# Patient Record
Sex: Female | Born: 1955
Health system: Southern US, Community
[De-identification: ages and names within clinical notes are randomized; demographics above are authoritative.]

## PROBLEM LIST (undated history)

## (undated) DIAGNOSIS — R7303 Prediabetes: Secondary | ICD-10-CM

## (undated) DIAGNOSIS — T7840XA Allergy, unspecified, initial encounter: Secondary | ICD-10-CM

## (undated) DIAGNOSIS — Z86010 Personal history of colonic polyps: Secondary | ICD-10-CM

## (undated) DIAGNOSIS — E669 Obesity, unspecified: Secondary | ICD-10-CM

## (undated) DIAGNOSIS — H269 Unspecified cataract: Secondary | ICD-10-CM

## (undated) HISTORY — PX: TUBAL LIGATION: SHX77

## (undated) HISTORY — DX: Personal history of colonic polyps: Z86.010

## (undated) HISTORY — DX: Unspecified cataract: H26.9

## (undated) HISTORY — DX: Obesity, unspecified: E66.9

## (undated) HISTORY — PX: WISDOM TOOTH EXTRACTION: SHX21

## (undated) HISTORY — PX: KELOID EXCISION: SHX1856

## (undated) HISTORY — DX: Allergy, unspecified, initial encounter: T78.40XA

## (undated) HISTORY — PX: CATARACT EXTRACTION W/ INTRAOCULAR LENS IMPLANT: SHX1309

## (undated) HISTORY — PX: POLYPECTOMY: SHX149

## (undated) HISTORY — PX: COLONOSCOPY: SHX174

## (undated) HISTORY — DX: Prediabetes: R73.03

---

## 1999-06-19 ENCOUNTER — Other Ambulatory Visit: Admission: RE | Admit: 1999-06-19 | Discharge: 1999-06-19 | Payer: Self-pay | Admitting: *Deleted

## 2001-05-13 ENCOUNTER — Other Ambulatory Visit: Admission: RE | Admit: 2001-05-13 | Discharge: 2001-05-13 | Payer: Self-pay | Admitting: Internal Medicine

## 2003-12-02 ENCOUNTER — Other Ambulatory Visit: Admission: RE | Admit: 2003-12-02 | Discharge: 2003-12-02 | Payer: Self-pay | Admitting: Internal Medicine

## 2006-01-28 ENCOUNTER — Ambulatory Visit: Payer: Self-pay | Admitting: Internal Medicine

## 2006-02-04 ENCOUNTER — Ambulatory Visit: Payer: Self-pay | Admitting: Internal Medicine

## 2006-02-04 ENCOUNTER — Other Ambulatory Visit: Admission: RE | Admit: 2006-02-04 | Discharge: 2006-02-04 | Payer: Self-pay | Admitting: Internal Medicine

## 2006-02-04 ENCOUNTER — Encounter: Payer: Self-pay | Admitting: Internal Medicine

## 2006-03-07 ENCOUNTER — Ambulatory Visit: Payer: Self-pay | Admitting: Internal Medicine

## 2006-03-21 ENCOUNTER — Ambulatory Visit: Payer: Self-pay | Admitting: Internal Medicine

## 2006-10-01 ENCOUNTER — Ambulatory Visit: Payer: Self-pay | Admitting: Internal Medicine

## 2006-10-21 ENCOUNTER — Ambulatory Visit: Payer: Self-pay | Admitting: Internal Medicine

## 2006-10-30 ENCOUNTER — Ambulatory Visit: Payer: Self-pay | Admitting: Internal Medicine

## 2006-10-30 LAB — CONVERTED CEMR LAB
Basophils Absolute: 0.1 10*3/uL (ref 0.0–0.1)
Eosinophils Absolute: 0.2 10*3/uL (ref 0.0–0.6)
Lymphocytes Relative: 40.6 % (ref 12.0–46.0)
MCHC: 33.5 g/dL (ref 30.0–36.0)
MCV: 83.2 fL (ref 78.0–100.0)
Neutro Abs: 3.3 10*3/uL (ref 1.4–7.7)
Platelets: 319 10*3/uL (ref 150–400)
RBC: 4.04 M/uL (ref 3.87–5.11)

## 2007-02-18 ENCOUNTER — Other Ambulatory Visit: Admission: RE | Admit: 2007-02-18 | Discharge: 2007-02-18 | Payer: Self-pay | Admitting: Obstetrics and Gynecology

## 2008-02-19 ENCOUNTER — Other Ambulatory Visit: Admission: RE | Admit: 2008-02-19 | Discharge: 2008-02-19 | Payer: Self-pay | Admitting: Obstetrics and Gynecology

## 2008-08-04 ENCOUNTER — Ambulatory Visit: Payer: Self-pay | Admitting: Obstetrics and Gynecology

## 2009-01-13 ENCOUNTER — Ambulatory Visit: Payer: Self-pay | Admitting: Family Medicine

## 2009-01-13 DIAGNOSIS — A088 Other specified intestinal infections: Secondary | ICD-10-CM | POA: Insufficient documentation

## 2009-03-15 ENCOUNTER — Other Ambulatory Visit: Admission: RE | Admit: 2009-03-15 | Discharge: 2009-03-15 | Payer: Self-pay | Admitting: Obstetrics and Gynecology

## 2009-03-15 ENCOUNTER — Encounter: Payer: Self-pay | Admitting: Obstetrics and Gynecology

## 2009-03-15 ENCOUNTER — Ambulatory Visit: Payer: Self-pay | Admitting: Obstetrics and Gynecology

## 2009-07-07 ENCOUNTER — Ambulatory Visit: Payer: Self-pay | Admitting: Obstetrics and Gynecology

## 2010-03-29 ENCOUNTER — Other Ambulatory Visit: Admission: RE | Admit: 2010-03-29 | Discharge: 2010-03-29 | Payer: Self-pay | Admitting: Obstetrics and Gynecology

## 2010-03-29 ENCOUNTER — Ambulatory Visit: Payer: Self-pay | Admitting: Obstetrics and Gynecology

## 2011-04-19 ENCOUNTER — Encounter: Payer: Self-pay | Admitting: Gynecology

## 2011-04-29 ENCOUNTER — Other Ambulatory Visit (HOSPITAL_COMMUNITY)
Admission: RE | Admit: 2011-04-29 | Discharge: 2011-04-29 | Disposition: A | Discharge status: Home / Self Care | Payer: BC Managed Care – PPO | Source: Ambulatory Visit | Attending: Obstetrics and Gynecology | Admitting: Obstetrics and Gynecology

## 2011-04-29 ENCOUNTER — Encounter: Payer: Self-pay | Admitting: Obstetrics and Gynecology

## 2011-04-29 ENCOUNTER — Ambulatory Visit (INDEPENDENT_AMBULATORY_CARE_PROVIDER_SITE_OTHER): Payer: BC Managed Care – PPO | Admitting: Obstetrics and Gynecology

## 2011-04-29 DIAGNOSIS — Z0000 Encounter for general adult medical examination without abnormal findings: Secondary | ICD-10-CM

## 2011-04-29 DIAGNOSIS — Z01419 Encounter for gynecological examination (general) (routine) without abnormal findings: Secondary | ICD-10-CM

## 2011-04-29 DIAGNOSIS — R809 Proteinuria, unspecified: Secondary | ICD-10-CM

## 2011-04-29 DIAGNOSIS — Z833 Family history of diabetes mellitus: Secondary | ICD-10-CM

## 2011-04-29 DIAGNOSIS — Z13220 Encounter for screening for lipoid disorders: Secondary | ICD-10-CM

## 2011-04-29 NOTE — Progress Notes (Signed)
The patient came to see me today for her annual GYN exam. She has had a normal bone density. She is due for her mammogram today. She is doing fine menopausally without medication.  Physical examination: HEENT within normal limits. Neck: Thyroid not large. No masses. Supraclavicular nodes: not enlarged. Breasts: Examined in both sitting midline position. No skin changes and no masses. Abdomen: Soft no guarding rebound or masses or hernia. Pelvic: External: Within normal limits. BUS: Within normal limits. Vaginal:within normal limits. Good estrogen effect. No evidence of cystocele rectocele or enterocele. Cervix: clean. Uterus: Normal size and shape. Adnexa: No masses. Rectovaginal exam: Confirmatory and negative. Extremities: Within normal limits.  Assessment: Normal GYN exam  Plan: 1. Mammogram 2. Appropriate lab work done.

## 2011-09-25 ENCOUNTER — Other Ambulatory Visit: Payer: Self-pay | Admitting: Gynecology

## 2011-09-25 DIAGNOSIS — E7800 Pure hypercholesterolemia, unspecified: Secondary | ICD-10-CM

## 2011-11-26 ENCOUNTER — Emergency Department (HOSPITAL_COMMUNITY): Payer: BC Managed Care – PPO

## 2011-11-26 ENCOUNTER — Encounter (HOSPITAL_COMMUNITY): Payer: Self-pay | Admitting: *Deleted

## 2011-11-26 ENCOUNTER — Emergency Department (HOSPITAL_COMMUNITY)
Admission: EM | Admit: 2011-11-26 | Discharge: 2011-11-26 | Disposition: A | Discharge status: Home / Self Care | Payer: BC Managed Care – PPO | Attending: Emergency Medicine | Admitting: Emergency Medicine

## 2011-11-26 ENCOUNTER — Other Ambulatory Visit: Payer: Self-pay

## 2011-11-26 DIAGNOSIS — R079 Chest pain, unspecified: Secondary | ICD-10-CM | POA: Insufficient documentation

## 2011-11-26 DIAGNOSIS — R0789 Other chest pain: Secondary | ICD-10-CM | POA: Insufficient documentation

## 2011-11-26 DIAGNOSIS — E669 Obesity, unspecified: Secondary | ICD-10-CM | POA: Insufficient documentation

## 2011-11-26 LAB — POCT I-STAT, CHEM 8
BUN: 9 mg/dL (ref 6–23)
Chloride: 105 mEq/L (ref 96–112)
Creatinine, Ser: 0.9 mg/dL (ref 0.50–1.10)
Glucose, Bld: 93 mg/dL (ref 70–99)
Hemoglobin: 13.6 g/dL (ref 12.0–15.0)
Potassium: 3.9 mEq/L (ref 3.5–5.1)
Sodium: 142 mEq/L (ref 135–145)

## 2011-11-26 LAB — POCT I-STAT TROPONIN I

## 2011-11-26 NOTE — ED Notes (Signed)
MD at bedside. (Dr. Jacubowitz) 

## 2011-11-26 NOTE — ED Notes (Signed)
Pt in c/o chest pain since this am, pain to central chest, denies other symptoms with this or radiation, describes pain as sharp[facility]

## 2011-11-26 NOTE — ED Notes (Signed)
Family at bedside. 

## 2011-11-26 NOTE — ED Provider Notes (Signed)
History     CSN: [id number]  Arrival date & time 11/26/11  1158   First MD Initiated Contact with Patient 11/26/11 1214      Chief Complaint  Patient presents with  . Chest Pain    (Consider location/radiation/quality/duration/timing/severity/associated sxs/prior treatment) HPI Complains of right-sided parasternal anterior chest pain nonradiating sudden onset 10:55 AM today. Pain is severe at first presently is mild without treatment no shortness of breath no sweatiness no nausea no other symptoms. Pain is worse with deep inspiration improving with time no other complaint History reviewed. No pertinent past medical history.  Past Surgical History  Procedure Date  . Tubal ligation   . Cesarean section    Cardiac risk factors none Family History  Problem Relation Age of Onset  . Hypertension Mother   . Diabetes Sister   . Diabetes Son     History  Substance Use Topics  . Smoking status: Never Smoker   . Smokeless tobacco: Never Used  . Alcohol Use: Yes     rare    OB History    Grav Para Term Preterm Abortions TAB SAB Ect Mult Living   1 1 1       1       Review of Systems  Cardiovascular: Positive for chest pain.  All other systems reviewed and are negative.    Allergies  Penicillins  Home Medications   Current Outpatient Rx  Name Route Sig Dispense Refill  . CALCIUM + D PO Oral Take by mouth daily.      Marland Kitchen VITAMIN D 1000 UNITS PO TABS Oral Take 1,000 Units by mouth daily.      . OMEGA-3 FATTY ACIDS 1000 MG PO CAPS Oral Take 2 g by mouth daily.      Marland Kitchen ONE-DAILY MULTI VITAMINS PO TABS Oral Take 1 tablet by mouth daily.        BP 144/75  Pulse 66  Temp(Src) 98.3 F (36.8 C) (Oral)  Resp 20  SpO2 100%  Physical Exam  Nursing note and vitals reviewed. Constitutional: She appears well-developed and well-nourished.  HENT:  Head: Normocephalic and atraumatic.  Eyes: Conjunctivae are normal. Pupils are equal, round, and reactive to light.  Neck: Neck  supple. No tracheal deviation present. No thyromegaly present.  Cardiovascular: Normal rate and regular rhythm.   No murmur heard. Pulmonary/Chest: Effort normal and breath sounds normal. She exhibits tenderness.       Mild tenderness right parasternal area reproducing pain exactly  Abdominal: Soft. Bowel sounds are normal. She exhibits no distension. There is no tenderness.       OBese  Musculoskeletal: Normal range of motion. She exhibits no edema and no tenderness.  Neurological: She is alert. Coordination normal.  Skin: Skin is warm and dry. No rash noted.  Psychiatric: She has a normal mood and affect.    ED Course  Procedures (including critical care time)  Date: 11/26/2011  Rate: 70  Rhythm: normal sinus rhythm  QRS Axis: normal  Intervals: normal  ST/T Wave abnormalities: nonspecific T wave changes  Conduction Disutrbances:none  Narrative Interpretation:   Old EKG Reviewed: none available 5:20 PM patient alert pleasant cooperative asymptomatic  Labs Reviewed  D-DIMER, QUANTITATIVE   No results found.  Results for orders placed during the hospital encounter of 11/26/11  D-DIMER, QUANTITATIVE      Component Value Range   D-Dimer, Quant 0.38  0.00 - 0.48 (ug/mL-FEU)  POCT I-STAT TROPONIN I      Component Value Range  Troponin i, poc 0.00  0.00 - 0.08 (ng/mL)   Comment 3           POCT I-STAT, CHEM 8      Component Value Range   Sodium 142  135 - 145 (mEq/L)   Potassium 3.9  3.5 - 5.1 (mEq/L)   Chloride 105  96 - 112 (mEq/L)   BUN 9  6 - 23 (mg/dL)   Creatinine, Ser 8.29  0.50 - 1.10 (mg/dL)   Glucose, Bld 93  70 - 99 (mg/dL)   Calcium, Ion 5.62  1.12 - 1.32 (mmol/L)   TCO2 28  0 - 100 (mmol/L)   Hemoglobin 13.6  12.0 - 15.0 (g/dL)   HCT 13.0  86.5 - 78.4 (%)  POCT I-STAT TROPONIN I      Component Value Range   Troponin i, poc 0.00  0.00 - 0.08 (ng/mL)   Comment 3            Dg Chest 2 View  11/26/2011  *RADIOLOGY REPORT*  Clinical Data: Chest pain.  CHEST  - 2 VIEW  Comparison: None.  Findings: No infiltrate, congestive heart failure or pneumothorax.  Mildly tortuous aorta.  Heart size top normal.  IMPRESSION: Mildly tortuous aorta.  Please see above.  Original Report Authenticated By: Fuller Canada, M.D.   No diagnosis found.    MDM   Strongly doubyt ACS ie sudden onset pleuric pain non exertional with 2 neg marker. Low pretest prob for pe and neg ddimer. Plan d/c to home Dx Non cardiac chest pain      Doug Sou, MD 11/26/11 1737

## 2011-11-26 NOTE — Discharge Instructions (Signed)
Chest Pain, Nonspecific  It is often hard to give a specific diagnosis for the cause of chest pain. There is always a chance that your pain could be related to something serious, like a heart attack or a blood clot in the lungs. You need to follow up with your caregiver for further evaluation. More lab tests or other studies such as X-rays, electrocardiography, stress testing, or cardiac imaging may be needed to find the cause of your pain.  Most of the time, nonspecific chest pain improves within 2 to 3 days with rest and mild pain medicine. For the next few days, avoid physical exertion or activities that bring on pain. Do not smoke. Avoid drinking alcohol. Call your caregiver for routine follow-up as advised.   SEEK IMMEDIATE MEDICAL CARE IF:   You develop increased chest pain or pain that radiates to the arm, neck, jaw, back, or abdomen.   You develop shortness of breath, increased coughing, or you start coughing up blood.   You have severe back or abdominal pain, nausea, or vomiting.   You develop severe weakness, fainting, fever, or chills.  Document Released: 09/09/2005 Document Revised: 08/29/2011 Document Reviewed: 02/27/2007  Exit[employer] Patient Information 2012 [employer].

## 2011-12-10 ENCOUNTER — Other Ambulatory Visit: Payer: BC Managed Care – PPO

## 2012-04-30 ENCOUNTER — Encounter: Payer: Self-pay | Admitting: Obstetrics and Gynecology

## 2012-05-07 ENCOUNTER — Encounter: Payer: Self-pay | Admitting: Obstetrics and Gynecology

## 2012-05-07 ENCOUNTER — Ambulatory Visit (INDEPENDENT_AMBULATORY_CARE_PROVIDER_SITE_OTHER): Payer: BC Managed Care – PPO | Admitting: Obstetrics and Gynecology

## 2012-05-07 ENCOUNTER — Other Ambulatory Visit (HOSPITAL_COMMUNITY)
Admission: RE | Admit: 2012-05-07 | Discharge: 2012-05-07 | Disposition: A | Discharge status: Home / Self Care | Payer: BC Managed Care – PPO | Source: Ambulatory Visit | Attending: Obstetrics and Gynecology | Admitting: Obstetrics and Gynecology

## 2012-05-07 VITALS — BP 110/70 | Ht 63.0 in | Wt 202.0 lb

## 2012-05-07 DIAGNOSIS — Z01419 Encounter for gynecological examination (general) (routine) without abnormal findings: Secondary | ICD-10-CM | POA: Insufficient documentation

## 2012-05-07 DIAGNOSIS — Z833 Family history of diabetes mellitus: Secondary | ICD-10-CM

## 2012-05-07 DIAGNOSIS — Z780 Asymptomatic menopausal state: Secondary | ICD-10-CM

## 2012-05-07 LAB — CBC WITH DIFFERENTIAL/PLATELET
HCT: 38.5 % (ref 36.0–46.0)
Hemoglobin: 12.8 g/dL (ref 12.0–15.0)
Lymphs Abs: 2.8 10*3/uL (ref 0.7–4.0)
MCHC: 33.2 g/dL (ref 30.0–36.0)
Monocytes Absolute: 0.4 10*3/uL (ref 0.1–1.0)
Monocytes Relative: 7 % (ref 3–12)
Neutro Abs: 2.7 10*3/uL (ref 1.7–7.7)
Neutrophils Relative %: 44 % (ref 43–77)
RBC: 4.66 MIL/uL (ref 3.87–5.11)

## 2012-05-07 LAB — LIPID PANEL
Cholesterol: 195 mg/dL (ref 0–200)
HDL: 55 mg/dL (ref >39)
LDL Cholesterol: 126 mg/dL — ABNORMAL HIGH (ref 0–99)
Total CHOL/HDL Ratio: 3.5 Ratio
Triglycerides: 72 mg/dL (ref <150)
VLDL: 14 mg/dL (ref 0–40)

## 2012-05-07 NOTE — Patient Instructions (Signed)
Schedule bone density.    

## 2012-05-07 NOTE — Addendum Note (Signed)
Addended by: Dayna Barker on: 05/07/2012 12:11 PM   Modules accepted: Orders

## 2012-05-07 NOTE — Progress Notes (Signed)
Patient came to see me today for her annual GYN exam. She is doing well without hormone replacement. She is up-to-date on mammograms. She is having no vaginal bleeding. She is having no pelvic pain. She had one normal bone density in 2009. She has never had an abnormal Pap smear. For the past 2 months she has been having pain in her lower extremities  mostly in her back. She says it occurs when she works her second job  and is on her feet on a concrete floor. She wanted name of a PCP. She had a normal colonoscopy 6 years ago.  Physical examination: Mechele Collin present. HEENT within normal limits. Neck: Thyroid not large. No masses. Supraclavicular nodes: not enlarged. Breasts: Examined in both sitting and lying  position. No skin changes and no masses. Abdomen: Soft no guarding rebound or masses or hernia. Pelvic: External: Within normal limits. BUS: Within normal limits. Vaginal:within normal limits. Good estrogen effect. No evidence of cystocele rectocele or enterocele. Cervix: clean. Uterus: Normal size and shape. Adnexa: No masses. Rectovaginal exam: Confirmatory and negative. Extremities: Within normal limits. Negative Homan sign. No swelling. No cords.  Assessment: Lower extremity pain.  Plan: Continue yearly mammograms. Patient to see either  Dr. Darrick Penna or Dr. Farris Has for evaluation of legs. Gave patient Dr. Nelida Meuse name for  PCP. Bone density.The new Pap smear guidelines were discussed with the patient. Patient requested pap and was done.

## 2012-05-08 ENCOUNTER — Telehealth: Payer: Self-pay | Admitting: *Deleted

## 2012-05-08 ENCOUNTER — Other Ambulatory Visit: Payer: Self-pay | Admitting: Obstetrics and Gynecology

## 2012-05-08 DIAGNOSIS — R7303 Prediabetes: Secondary | ICD-10-CM

## 2012-05-08 DIAGNOSIS — R7309 Other abnormal glucose: Secondary | ICD-10-CM

## 2012-05-08 LAB — URINALYSIS W MICROSCOPIC + REFLEX CULTURE
Casts: NONE SEEN
Glucose, UA: NEGATIVE mg/dL
Hgb urine dipstick: NEGATIVE
Leukocytes, UA: NEGATIVE
Nitrite: NEGATIVE
Squamous Epithelial / LPF: NONE SEEN
pH: 5 (ref 5.0–8.0)

## 2012-05-08 LAB — HEMOGLOBIN A1C
Hgb A1c MFr Bld: 6.3 % — ABNORMAL HIGH (ref <5.7)
Mean Plasma Glucose: 134 mg/dL — ABNORMAL HIGH (ref <117)

## 2012-05-08 NOTE — Telephone Encounter (Signed)
Message copied by Aura Camps on Fri May 08, 2012  2:07 PM ------      Message from: Keenan Bachelor      Created: Fri May 08, 2012 11:38 AM       Per Dr. Reece Agar patient to have referral to [facility]al Counseling services for elevated HgbA1C results.              Patient does want this. She said she is in school so early morning or late afternoon after 3pm will be best times to schedule appt.            Thanks Victorino Dike!!!!!!!!!!

## 2012-05-08 NOTE — Telephone Encounter (Signed)
Referral order placed, nutritional department will contact pt.

## 2012-05-11 ENCOUNTER — Encounter: Payer: Self-pay | Admitting: *Deleted

## 2012-05-11 NOTE — Telephone Encounter (Signed)
06/02/12 appointment time for the below note.

## 2012-05-11 NOTE — Progress Notes (Signed)
Patient ID: Janice Williamson, female   DOB: 1955-10-18, 56 y.o.   MRN: [id number] Per staff message appointment with Nutritional counseling services set up 06/02/12 @ 9:15 am at [facility].

## 2012-05-21 ENCOUNTER — Ambulatory Visit (INDEPENDENT_AMBULATORY_CARE_PROVIDER_SITE_OTHER): Payer: BC Managed Care – PPO

## 2012-05-21 DIAGNOSIS — Z780 Asymptomatic menopausal state: Secondary | ICD-10-CM

## 2012-05-21 DIAGNOSIS — M899 Disorder of bone, unspecified: Secondary | ICD-10-CM

## 2012-05-21 DIAGNOSIS — M8580 Other specified disorders of bone density and structure, unspecified site: Secondary | ICD-10-CM

## 2012-05-21 DIAGNOSIS — M949 Disorder of cartilage, unspecified: Secondary | ICD-10-CM

## 2012-06-02 ENCOUNTER — Encounter: Payer: BC Managed Care – PPO | Attending: Obstetrics and Gynecology | Admitting: *Deleted

## 2012-06-02 ENCOUNTER — Encounter: Payer: Self-pay | Admitting: *Deleted

## 2012-06-02 VITALS — Ht 64.0 in | Wt 199.4 lb

## 2012-06-02 DIAGNOSIS — R7309 Other abnormal glucose: Secondary | ICD-10-CM

## 2012-06-02 DIAGNOSIS — Z713 Dietary counseling and surveillance: Secondary | ICD-10-CM | POA: Insufficient documentation

## 2012-06-02 NOTE — Progress Notes (Signed)
  Medical Nutrition Therapy:  Appt start time: 0900 end time:  1000 and 1100.   Assessment:  Primary concerns today: elevated glucose.   MEDICATIONS: see list   DIETARY INTAKE:  Usual eating pattern includes 3 meals and 2-3 snacks per day.  Everyday foods include starches, fruits, vegetables, proteins.  Avoided foods include limits sweets to 1/week.    24-hr recall:  B ( AM): yogurt with walnuts and banana or boiled egg and toast with green tea unsweetened Snk ( AM): apple with peanut butter (or another fruit)  L ( PM): salad with tomatoes and either tuna or chicken and [place] dressing, 10 chips and cup jello Snk ( PM): another fruit or Nabs Snack (another Nabs D ( PM): bowl cereal ( honey nut cheerios) or boiled egg and slice toast.  On weekends eats out or has large family meal Snk ( PM): none Beverages: water and green tea.  Sweetened teas occasionally  Usual physical activity: exercises 6-7 days (jog, elliptical, and lifts weights) 45 minutes.  Has been active, but recently increased  Estimated energy needs: 1600 calories 180 g carbohydrates 120 g protein 44 g fat  Progress Towards Goal(s):  In progress.   Nutritional Diagnosis:  NB-1.1 Food and nutrition-related knowledge deficit As related to proper balance of fats, carbs, and proteins.  As evidenced by BMI of 34.2 and HgA1C of 6.3%.    Intervention:  Nutrition counseling provided.  Leanny is here for nutrition education regarding elevated glucose.  She denies family history of diabetes, but states she does want to prevent the diagnosis of diabetes.  Discussed phsyiology of diabetes and role of obesity on insulin resistance.  Encouraged moderate weight reduction.  Gayla is very physically active, but her dietary fat intake is higher than recommended.  She admits to eating fried foods and sweets on weekends and larger portions.  She eats out on the weekends, as well.  Discussed what foods increased blood glucose and  encouraged her to limit the servings of carbohydrates at meals.  Encouraged 2-3 servings/meal.  Her biggest struggle may be a late night meal when she gets off work around 9:30.  She doesn't want to cook or have a "heavy meal" so she make bowl of sugary cereal and then goes to sleep.   Discussed MyPlate recommendations and encouraged small serving lean protein and less carbs late at night.   Suggested preparing food in advance so she doesn't have to cook when she gets home.  Also encouraged limiting fried foods when eating out.  Handouts given during visit include:  My meal plan card  Reading food labels  Monitoring/Evaluation:  Dietary intake, exercise,  and body weight in 1 month(s).  Patient will follow up monthly until new HgA1C test in November

## 2012-06-02 NOTE — Patient Instructions (Signed)
Goals:  Eat 3 meals/day, Avoid meal skipping   Increase protein rich foods  Follow "Plate Method" for portion control  Limit carbohydrate1-2 servings/meal   Choose more whole grains, lean protein, low-fat dairy, and fruits/non-starchy vegetables.   Aim for >30 min of physical activity daily  Limit sugar-sweetened beverages and concentrated sweets  Limit fried foods  Limit carbohydrates late at night  Switch to diet juice

## 2012-06-03 ENCOUNTER — Ambulatory Visit: Payer: BC Managed Care – PPO | Admitting: Sports Medicine

## 2012-06-17 ENCOUNTER — Ambulatory Visit: Payer: BC Managed Care – PPO | Admitting: Sports Medicine

## 2012-07-02 ENCOUNTER — Encounter: Payer: BC Managed Care – PPO | Attending: Obstetrics and Gynecology | Admitting: *Deleted

## 2012-07-02 DIAGNOSIS — Z713 Dietary counseling and surveillance: Secondary | ICD-10-CM | POA: Insufficient documentation

## 2012-07-02 DIAGNOSIS — R7309 Other abnormal glucose: Secondary | ICD-10-CM | POA: Insufficient documentation

## 2012-07-07 ENCOUNTER — Ambulatory Visit: Payer: BC Managed Care – PPO | Admitting: Sports Medicine

## 2012-07-15 ENCOUNTER — Ambulatory Visit (INDEPENDENT_AMBULATORY_CARE_PROVIDER_SITE_OTHER): Payer: BC Managed Care – PPO | Admitting: Sports Medicine

## 2012-07-15 ENCOUNTER — Encounter: Payer: Self-pay | Admitting: Sports Medicine

## 2012-07-15 VITALS — BP 134/85 | HR 59 | Ht 64.0 in | Wt 193.0 lb

## 2012-07-15 DIAGNOSIS — M79661 Pain in right lower leg: Secondary | ICD-10-CM | POA: Insufficient documentation

## 2012-07-15 DIAGNOSIS — M79609 Pain in unspecified limb: Secondary | ICD-10-CM

## 2012-07-15 DIAGNOSIS — M79662 Pain in left lower leg: Secondary | ICD-10-CM | POA: Insufficient documentation

## 2012-07-15 NOTE — Progress Notes (Signed)
Chief complaint: Bilateral lower leg pain for 3-4 months  History of present illness: 56 year old female who is very pleasant coming in with a 3-4 month history of bilateral leg pain. Patient states that she does not remember any injury in this is more of an insidious onset over the course of months. Vision states that it seemed to start with low back pain that started about 2 months previously but then this resolved with her increasing her working out focusing on the treadmill, elliptical as well as core strengthening. Patient states that the pain is mostly on the lateral aspect of her calves but does radiate sometimes down near her ankles as well as up to the hamstrings. Patient denies any numbness 13 and sensation. Patient states it is worse with standing for a long amount of time and can come on within minutes. Patient though is able to do her treadmill walking and elliptical 3 days a week and do 45 minutes to an hour without any trouble. Patient denies any swelling any fevers chills any shortness of breath or chest pain.  Review of systems as stated above in history of present illness  Past medical history unremarkable Medications: Current outpatient prescriptions:Calcium Carbonate-Vitamin D (CALCIUM + D PO), Take by mouth daily.  , Disp: , Rfl: ;  cholecalciferol (VITAMIN D) 1000 UNITS tablet, Take 1,000 Units by mouth daily.  , Disp: , Rfl: ;  fish oil-omega-3 fatty acids 1000 MG capsule, Take 2 g by mouth daily.  , Disp: , Rfl: ;  MAGNESIUM PO, Take by mouth., Disp: , Rfl: ;  Multiple Vitamin (MULTIVITAMIN) tablet, Take 1 tablet by mouth daily.  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  Family history: Significant for diabetes in her sister.  Physical exam Blood pressure  134/85, pulse 59, height 5\' 4"  (1.626 m), weight 193 lb (87.544 kg). General: No apparent distress alert and oriented x3 mood and affect normal Respiratory: Patient to simple sentences and does not appear short of breath Skin: Clear dry intact with no signs of infection a rash Neuro: Cranial nerve II through XII are intact neurovascular intact in all extremities and 2+ DTRs. Back exam: Patient has full active range of motion, nontender to palpation, patient has negative straight leg test as well as negative Faber's test bilaterally. Patient has 5 out of 5 strength of the lower extremities. Knee exam is unremarkable with full range of motion in all ligaments intact and negative McMurray's Clinical exam: Patient's ankle appears to be in neutral position with 5 out of 5 strength neurovascularly intact distally. Foot exam: Patient does have small feet with a very short forefoot. Patient though has good alignment with stable longitudinal and transverse arches. Exam: Nontender on exam with no significant distortion. Patient has no significant external or internal tibial rotation.

## 2012-07-15 NOTE — Assessment & Plan Note (Signed)
Patient is having bilateral calf pain. This does not seem to be coming from the lumbar spine but we do need to consider potential spinal stenosis and claudication type symptoms in the long run. Patient's history though goes against this with her being able to do physical activity without any impedance. Patient does not have any high risk factors for vascular claudication and no signs on exam today. Likely this is more secondary to her job and prolonged standing-type activity. Today patient was given sports insoles with a heel lifts to try to decrease the amount of pressure on her calves. Patient was given compression sleeves to the calves bilaterally and to try wear work in 2 hours afterwards. Patient given home exercise program to increase stretching of the calf muscles themselves. Patient given red flags and when to seek medical attention. If patient is not improved in 4-6 weeks she will come back for followup. At that time if she is having trouble we will get a lumbar spine films to make sure we are not missing any spinal stenosis with spondylolisthesis that could be contributing to her troubles.

## 2012-07-29 ENCOUNTER — Encounter: Payer: Self-pay | Admitting: Family Medicine

## 2012-07-29 ENCOUNTER — Ambulatory Visit (INDEPENDENT_AMBULATORY_CARE_PROVIDER_SITE_OTHER): Payer: BC Managed Care – PPO | Admitting: Family Medicine

## 2012-07-29 VITALS — BP 120/88 | Temp 97.8°F | Ht 64.0 in | Wt 200.0 lb

## 2012-07-29 DIAGNOSIS — Z23 Encounter for immunization: Secondary | ICD-10-CM

## 2012-07-29 DIAGNOSIS — R7309 Other abnormal glucose: Secondary | ICD-10-CM

## 2012-07-29 DIAGNOSIS — Z0000 Encounter for general adult medical examination without abnormal findings: Secondary | ICD-10-CM | POA: Insufficient documentation

## 2012-07-29 DIAGNOSIS — R7303 Prediabetes: Secondary | ICD-10-CM | POA: Insufficient documentation

## 2012-07-29 NOTE — Progress Notes (Signed)
  Subjective:    Patient ID: Janice Williamson, female    DOB: 1955-11-04, 56 y.o.   MRN: [id number]  HPI Janice Williamson is a 56 year old single female nonsmoker who comes in today to establish as a new patient  She has seen Dr. Kirtland Bouchard. in the past however he was booked  She was hospitalized once for a C-section and had keloid treatment as an outpatient. She has a penicillin allergy which includes hives. She does not smoke does not drink is not taking medication on a regular basis.  Review of systems reveals that she gets routine eye care and wears glasses. She has regular dental care. No cardiac pulmonary or GI problems. Colonoscopy 2006 normal. G1 P1 LMP over a year ago. Rest review of systems negative. Social history she's single works in [employer] at [employer] also [employer] and she's going to school.  Family history as outlined dad died at 82 from a stroke no underlying apparent problems mother in good health 4 brothers in good health except for one OS hep C 5 sisters in good health except once diabetic.   Review of Systems  Constitutional: Negative.   HENT: Negative.   Eyes: Negative.   Respiratory: Negative.   Cardiovascular: Negative.   Gastrointestinal: Negative.   Genitourinary: Negative.   Musculoskeletal: Negative.   Neurological: Negative.   Hematological: Negative.   Psychiatric/Behavioral: Negative.        Objective:   Physical Exam  Constitutional: She appears well-developed and well-nourished.  HENT:  Head: Normocephalic and atraumatic.  Right Ear: External ear normal.  Left Ear: External ear normal.  Nose: Nose normal.  Mouth/Throat: Oropharynx is clear and moist.  Eyes: EOM are normal. Pupils are equal, round, and reactive to light.  Neck: Normal Williamson of motion. Neck supple. No thyromegaly present.  Cardiovascular: Normal rate, regular rhythm, normal heart sounds and intact distal pulses.  Exam reveals no gallop and no friction rub.   No murmur heard. Pulmonary/Chest:  Effort normal and breath sounds normal.  Abdominal: Soft. Bowel sounds are normal. She exhibits no distension and no mass. There is no tenderness. There is no rebound.  Genitourinary: Vagina normal and uterus normal. Guaiac negative stool. No vaginal discharge found.  Musculoskeletal: Normal Williamson of motion.  Lymphadenopathy:    She has no cervical adenopathy.  Neurological: She is alert. She has normal reflexes. No cranial nerve deficit. She exhibits normal muscle tone. Coordination normal.  Skin: Skin is warm and dry.       Total body skin exam normal she has 2 keloids on her back  Psychiatric: She has a normal mood and affect. Her behavior is normal. Judgment and thought content normal.          Assessment & Plan:  Healthy female  History of glucose intolerance,,,,,,,,, fasting blood sugar in August 1 36 hemoglobin A1c 6.3%

## 2012-07-29 NOTE — Patient Instructions (Addendum)
I would work hard on your daily walking and diet. We like to see a fasting blood sugar around 100  Followup in 4 months  Nonfasting labs one week prior

## 2012-08-13 ENCOUNTER — Ambulatory Visit: Payer: BC Managed Care – PPO | Admitting: Sports Medicine

## 2012-09-24 ENCOUNTER — Ambulatory Visit: Payer: BC Managed Care – PPO | Admitting: Sports Medicine

## 2012-11-08 ENCOUNTER — Emergency Department (HOSPITAL_COMMUNITY): Payer: BC Managed Care – PPO

## 2012-11-08 ENCOUNTER — Encounter (HOSPITAL_COMMUNITY): Payer: Self-pay

## 2012-11-08 ENCOUNTER — Emergency Department (HOSPITAL_COMMUNITY)
Admission: EM | Admit: 2012-11-08 | Discharge: 2012-11-08 | Disposition: A | Discharge status: Home / Self Care | Payer: BC Managed Care – PPO | Attending: Emergency Medicine | Admitting: Emergency Medicine

## 2012-11-08 DIAGNOSIS — S4980XA Other specified injuries of shoulder and upper arm, unspecified arm, initial encounter: Secondary | ICD-10-CM | POA: Insufficient documentation

## 2012-11-08 DIAGNOSIS — Y929 Unspecified place or not applicable: Secondary | ICD-10-CM | POA: Insufficient documentation

## 2012-11-08 DIAGNOSIS — Y9329 Activity, other involving ice and snow: Secondary | ICD-10-CM | POA: Insufficient documentation

## 2012-11-08 DIAGNOSIS — S46909A Unspecified injury of unspecified muscle, fascia and tendon at shoulder and upper arm level, unspecified arm, initial encounter: Secondary | ICD-10-CM | POA: Insufficient documentation

## 2012-11-08 DIAGNOSIS — W19XXXA Unspecified fall, initial encounter: Secondary | ICD-10-CM

## 2012-11-08 DIAGNOSIS — Z79899 Other long term (current) drug therapy: Secondary | ICD-10-CM | POA: Insufficient documentation

## 2012-11-08 DIAGNOSIS — M25511 Pain in right shoulder: Secondary | ICD-10-CM

## 2012-11-08 DIAGNOSIS — W010XXA Fall on same level from slipping, tripping and stumbling without subsequent striking against object, initial encounter: Secondary | ICD-10-CM | POA: Insufficient documentation

## 2012-11-08 DIAGNOSIS — E669 Obesity, unspecified: Secondary | ICD-10-CM | POA: Insufficient documentation

## 2012-11-08 MED ORDER — MELOXICAM 15 MG PO TABS: 15.0000 mg | ORAL_TABLET | Freq: Every day | ORAL | Status: DC

## 2012-11-08 MED ORDER — METHOCARBAMOL 500 MG PO TABS: 500.0000 mg | ORAL_TABLET | Freq: Two times a day (BID) | ORAL | Status: DC

## 2012-11-08 MED ORDER — HYDROCODONE-ACETAMINOPHEN 5-325 MG PO TABS: 1.0000 | ORAL_TABLET | Freq: Four times a day (QID) | ORAL | Status: DC | PRN

## 2012-11-08 NOTE — ED Provider Notes (Signed)
History     CSN: [id number]  Arrival date & time 11/08/12  7564   First MD Initiated Contact with Patient 11/08/12 [phone]      Chief Complaint  Patient presents with  . Shoulder Pain    (Consider location/radiation/quality/duration/timing/severity/associated sxs/prior treatment) The history is provided by the patient. No language interpreter was used.   SUBJECTIVE: Janice Williamson is a 58 y.o. female who sustained a right shoulder injury 15 hour(s) ago. Mechanism of injury: patient slipped on the ice and landed on her right shoulder. Immediate symptoms: delayed pain (awoke this morning with increased pain in the trapezius muscle) . Symptoms have been acute since that time. Prior history of related problems: no prior problems with this area in the past.  Denies numbness or tingling in the right hand.  Denies neck pain.    Past Medical History  Diagnosis Date  . Obesity   . Allergy     Past Surgical History  Procedure Laterality Date  . Tubal ligation    . Cesarean section      Family History  Problem Relation Age of Onset  . Hypertension Mother   . Diabetes Sister   . Diabetes Son   . Drug abuse Other   . Diabetes Other     History  Substance Use Topics  . Smoking status: Never Smoker   . Smokeless tobacco: Never Used  . Alcohol Use: Yes     Comment: rare    OB History   Grav Para Term Preterm Abortions TAB SAB Ect Mult Living   1 1 1       1       Review of Systems Ten systems reviewed and are negative for acute change, except as noted in the HPI.   Allergies  Almond meal; Almond oil; and Penicillins  Home Medications   Current Outpatient Rx  Name  Route  Sig  Dispense  Refill  . ibuprofen (ADVIL,MOTRIN) 200 MG tablet   Oral   Take 1,000 mg by mouth daily as needed for pain.         . Calcium Carbonate-Vitamin D (CALCIUM + D PO)   Oral   Take 1 tablet by mouth daily.          . cholecalciferol (VITAMIN D) 1000 UNITS tablet   Oral   Take  1,000 Units by mouth daily.          Marland Kitchen co-enzyme Q-10 30 MG capsule   Oral   Take 30 mg by mouth daily.          . fish oil-omega-3 fatty acids 1000 MG capsule   Oral   Take 1 g by mouth daily.          . Glucosamine 500 MG CAPS   Oral   Take by mouth.         Marland Kitchen MAGNESIUM PO   Oral   Take by mouth.         . Misc Natural Products (TART CHERRY ADVANCED PO)   Oral   Take 1 tablet by mouth daily.          . Multiple Vitamin (MULTIVITAMIN) tablet   Oral   Take 1 tablet by mouth daily.           . Probiotic Product (PROBIOTIC DAILY PO)   Oral   Take 1 tablet by mouth daily.            BP 131/81  Pulse 56  Temp(Src) 97.3  F (36.3 C) (Oral)  Resp 16  SpO2 100%  Physical Exam Vital signs as noted above. Appearance: alert, well appearing, and in no distress. Shoulder exam: normal exam, no swelling, tenderness, instability; ligaments intact, mild tenderness with abduction of the shoulder joint.  Full passive range of motion of range of motion of the cervical spine.  She is tender to palpation of the right trapezius with noted spasm of the tissue. X-ray: no fracture or dislocation noted. HENT:  Head: Normocephalic and atraumatic.  Eyes: Conjunctivae normal and EOM are normal. Pupils are equal, round, and reactive to light. No scleral icterus.   Cardiovascular: Normal rate, regular rhythm and normal heart sounds.  Exam reveals no gallop and no friction rub.   No murmur heard. Pulmonary/Chest: Effort normal and breath sounds normal. No respiratory distress.  Abdominal: Soft. Bowel sounds are normal. She exhibits no distension and no mass. There is no tenderness. There is no guarding.  Neurological: She is alert and oriented to person, place, and time.  Skin: Skin is warm and dry. She is not diaphoretic.     ED Course  Procedures (including critical care time)  Labs Reviewed - No data to display Dg Shoulder Right  11/08/2012  *RADIOLOGY REPORT*  Clinical  Data: Right shoulder pain following fall.  RIGHT SHOULDER - 2+ VIEW  Comparison: None  Findings: There is no evidence of fracture, subluxation or dislocation. Mild degenerative changes of the [facility] joint noted. No focal bony lesions are identified. The visualized right bony thorax is unremarkable.  IMPRESSION: No evidence of acute bony abnormality.  Mild AC joint degenerative changes.   Original Report Authenticated By: Harmon Pier, M.D.      1. Fall   2. Shoulder pain, right       MDM  9:07 AM Patient with injury to the right shoulder.  No acute fracture or dislocation is noted.  She has what appears to be spasm of the trapezius muscle.  Discharging patient with pain control, anti-inflammatory and muscle relaxer.  Ortho followup.  At this time there does not appear to be any evidence of an acute emergency medical condition and the patient appears stable for discharge with appropriate outpatient follow up.Diagnosis was discussed with patient who verbalizes understanding and is agreeable to discharge.      Arthor Captain, PA-C 11/08/12 1458

## 2012-11-08 NOTE — ED Notes (Signed)
Ortho Tech paged for sling. °

## 2012-11-08 NOTE — ED Notes (Signed)
Patient transported to X-ray 

## 2012-11-08 NOTE — Progress Notes (Signed)
Orthopedic Tech Progress Note Patient Details:  Janice Williamson Mar 13, 1956 [id number] Arm sling applied to Right UE with no complications.  Ortho Devices Type of Ortho Device: Arm sling Ortho Device/Splint Location: Right Ortho Device/Splint Interventions: Application   Asia R Thompson 11/08/2012, 9:24 AM

## 2012-11-08 NOTE — ED Notes (Signed)
Pt states she was moving a limb out of the driveway last night and slipped and fell hurting her right shoulder.

## 2012-11-09 NOTE — ED Provider Notes (Signed)
Medical screening examination/treatment/procedure(s) were performed by non-physician practitioner and as supervising physician I was immediately available for consultation/collaboration.  Gilda Crease, MD 11/09/12 [phone]

## 2012-11-26 ENCOUNTER — Other Ambulatory Visit (INDEPENDENT_AMBULATORY_CARE_PROVIDER_SITE_OTHER): Payer: BC Managed Care – PPO

## 2012-11-26 DIAGNOSIS — R7309 Other abnormal glucose: Secondary | ICD-10-CM

## 2012-11-26 DIAGNOSIS — R7303 Prediabetes: Secondary | ICD-10-CM

## 2012-11-26 LAB — BASIC METABOLIC PANEL
BUN: 14 mg/dL (ref 6–23)
Calcium: 9 mg/dL (ref 8.4–10.5)
Creatinine, Ser: 0.9 mg/dL (ref 0.4–1.2)
GFR: 83.5 mL/min (ref >60.00)
Glucose, Bld: 102 mg/dL — ABNORMAL HIGH (ref 70–99)

## 2012-12-07 ENCOUNTER — Ambulatory Visit: Payer: BC Managed Care – PPO | Admitting: Family Medicine

## 2013-01-05 ENCOUNTER — Ambulatory Visit: Payer: BC Managed Care – PPO | Admitting: Family Medicine

## 2013-01-06 ENCOUNTER — Ambulatory Visit: Payer: BC Managed Care – PPO | Admitting: Family Medicine

## 2013-05-03 ENCOUNTER — Other Ambulatory Visit: Payer: BC Managed Care – PPO

## 2013-05-10 ENCOUNTER — Encounter: Payer: BC Managed Care – PPO | Admitting: Family Medicine

## 2013-06-08 ENCOUNTER — Other Ambulatory Visit: Payer: BC Managed Care – PPO

## 2013-06-14 ENCOUNTER — Encounter: Payer: BC Managed Care – PPO | Admitting: Family Medicine

## 2013-06-15 ENCOUNTER — Encounter: Payer: BC Managed Care – PPO | Admitting: Family Medicine

## 2013-07-08 ENCOUNTER — Other Ambulatory Visit (INDEPENDENT_AMBULATORY_CARE_PROVIDER_SITE_OTHER): Payer: BC Managed Care – PPO

## 2013-07-08 DIAGNOSIS — Z0000 Encounter for general adult medical examination without abnormal findings: Secondary | ICD-10-CM

## 2013-07-08 LAB — POCT URINALYSIS DIPSTICK
Bilirubin, UA: NEGATIVE
Blood, UA: NEGATIVE
Nitrite, UA: NEGATIVE
Protein, UA: NEGATIVE
Urobilinogen, UA: 0.2
pH, UA: 7

## 2013-07-15 ENCOUNTER — Ambulatory Visit (INDEPENDENT_AMBULATORY_CARE_PROVIDER_SITE_OTHER): Payer: BC Managed Care – PPO | Admitting: Family Medicine

## 2013-07-15 ENCOUNTER — Encounter: Payer: Self-pay | Admitting: Family Medicine

## 2013-07-15 VITALS — BP 118/80 | Temp 97.8°F | Ht 63.75 in | Wt 184.0 lb

## 2013-07-15 DIAGNOSIS — R7303 Prediabetes: Secondary | ICD-10-CM

## 2013-07-15 DIAGNOSIS — Z0000 Encounter for general adult medical examination without abnormal findings: Secondary | ICD-10-CM

## 2013-07-15 DIAGNOSIS — R7309 Other abnormal glucose: Secondary | ICD-10-CM

## 2013-07-15 NOTE — Progress Notes (Signed)
  Subjective:    Patient ID: Janice Williamson, female    DOB: 1956-07-16, 57 y.o.   MRN: [id number]  HPI Janice Williamson is a 57 year old female nonsmoker who comes in today for general physical examination  She has done extremely well with her diet. She's lost 16 pounds and her blood sugars drop back to normal. She is in a prediabetic state.  She gets routine eye care, dental care, BSE monthly, and you mammography, colonoscopy normal  Pelvic and Pap 2013 by Dr. Auburn Bilberry. No history of previous cervical problems therefore she can have a Pap every 3 years  She does take a number of over-the-counter roots herbs and vitamins advised that they're not necessary   Review of Systems  Constitutional: Negative.   HENT: Negative.   Eyes: Negative.   Respiratory: Negative.   Cardiovascular: Negative.   Gastrointestinal: Negative.   Genitourinary: Negative.   Musculoskeletal: Negative.   Neurological: Negative.   Psychiatric/Behavioral: Negative.        Objective:   Physical Exam  Constitutional: She is oriented to person, place, and time. She appears well-developed and well-nourished.  HENT:  Head: Normocephalic and atraumatic.  Right Ear: External ear normal.  Left Ear: External ear normal.  Nose: Nose normal.  Mouth/Throat: Oropharynx is clear and moist.  Eyes: EOM are normal. Pupils are equal, round, and reactive to light.  Neck: Normal Williamson of motion. Neck supple. No thyromegaly present.  Cardiovascular: Normal rate, regular rhythm, normal heart sounds and intact distal pulses.  Exam reveals no gallop and no friction rub.   No murmur heard. Pulmonary/Chest: Effort normal and breath sounds normal.  Abdominal: Soft. Bowel sounds are normal. She exhibits no distension and no mass. There is no tenderness. There is no rebound.  Genitourinary: Vagina normal and uterus normal. Guaiac negative stool. No vaginal discharge found.  Bilateral breast exam normal she has a 5 mm x5 mm freckle on her  left breast just above the areola at the 12:00 position although its large I feel is benign  Musculoskeletal: Normal Williamson of motion.  Lymphadenopathy:    She has no cervical adenopathy.  Neurological: She is alert and oriented to person, place, and time. She has normal reflexes. No cranial nerve deficit. She exhibits normal muscle tone. Coordination normal.  Skin: Skin is warm and dry.  Psychiatric: She has a normal mood and affect. Her behavior is normal. Judgment and thought content normal.          Assessment & Plan:  Healthy female  Glucose intolerance soft with diet exercise and weight loss continue diet exercise and weight loss followup in 6 months blood sugar and A1c  Postmenopausal relatively asymptomatic therefore no therapy

## 2013-07-15 NOTE — Patient Instructions (Signed)
Continue good diet and exercise  You can stop the over-the-counter vitamins and supplements  Return in 6 months for followup  Nonfasting labs one week prior

## 2013-09-14 IMAGING — CR DG SHOULDER 2+V*R*
3 series · 3 of 3 positions shown · non-contrast
Comparison: None

CLINICAL DATA: Right shoulder pain following fall.

RIGHT SHOULDER - 2+ VIEW

[w shoulder ap internal righ]
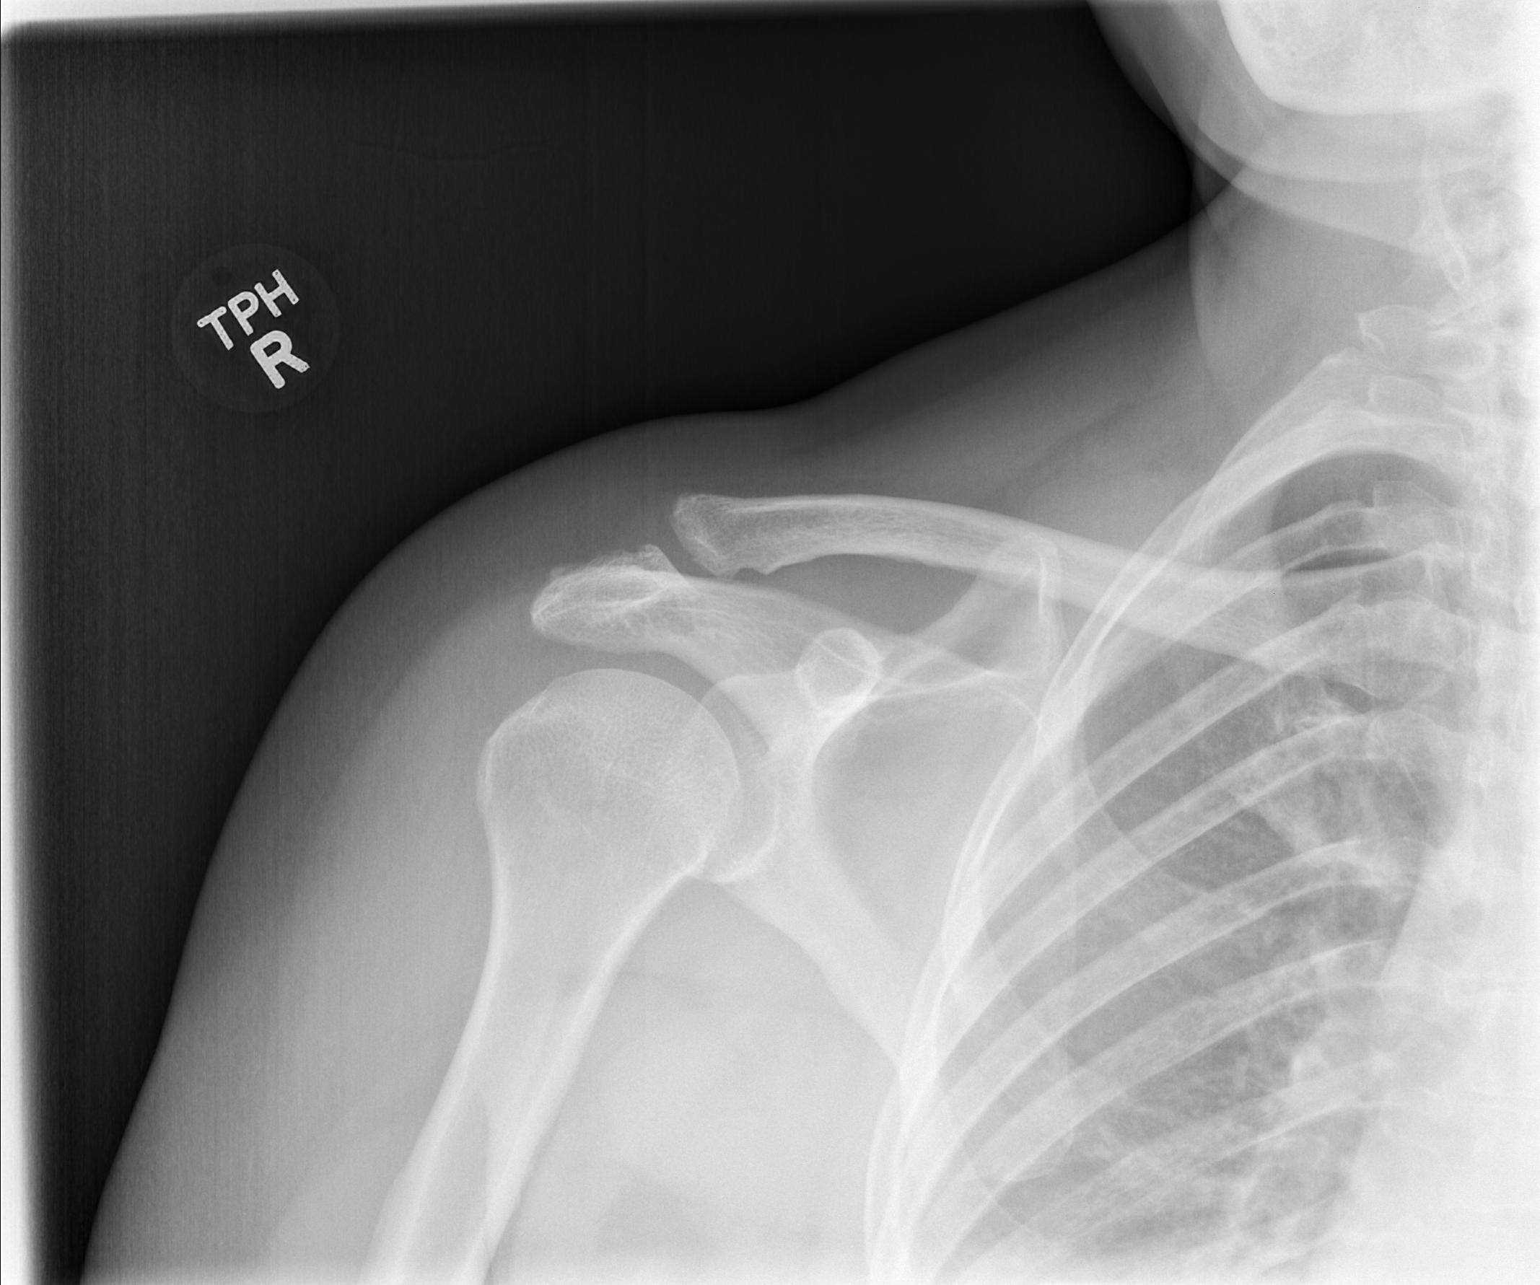

[w shoulder ap external righ]
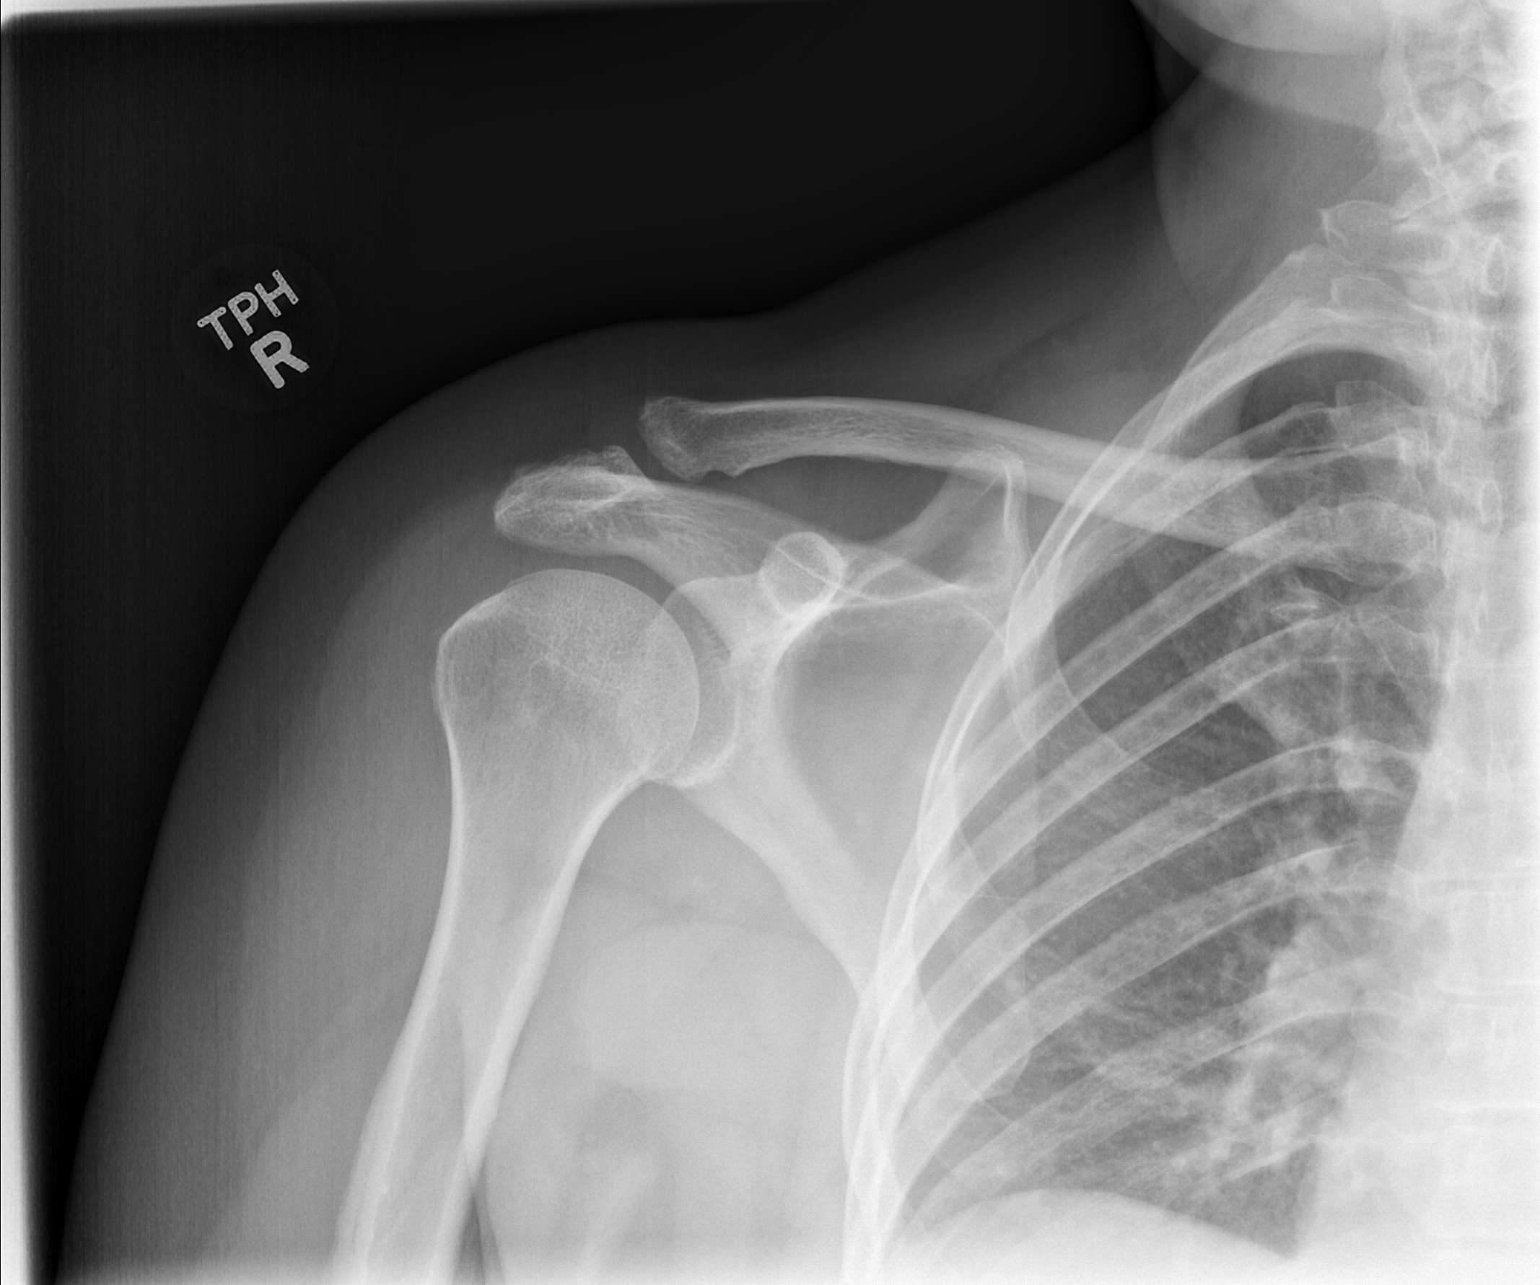

[w shoulder y view right]
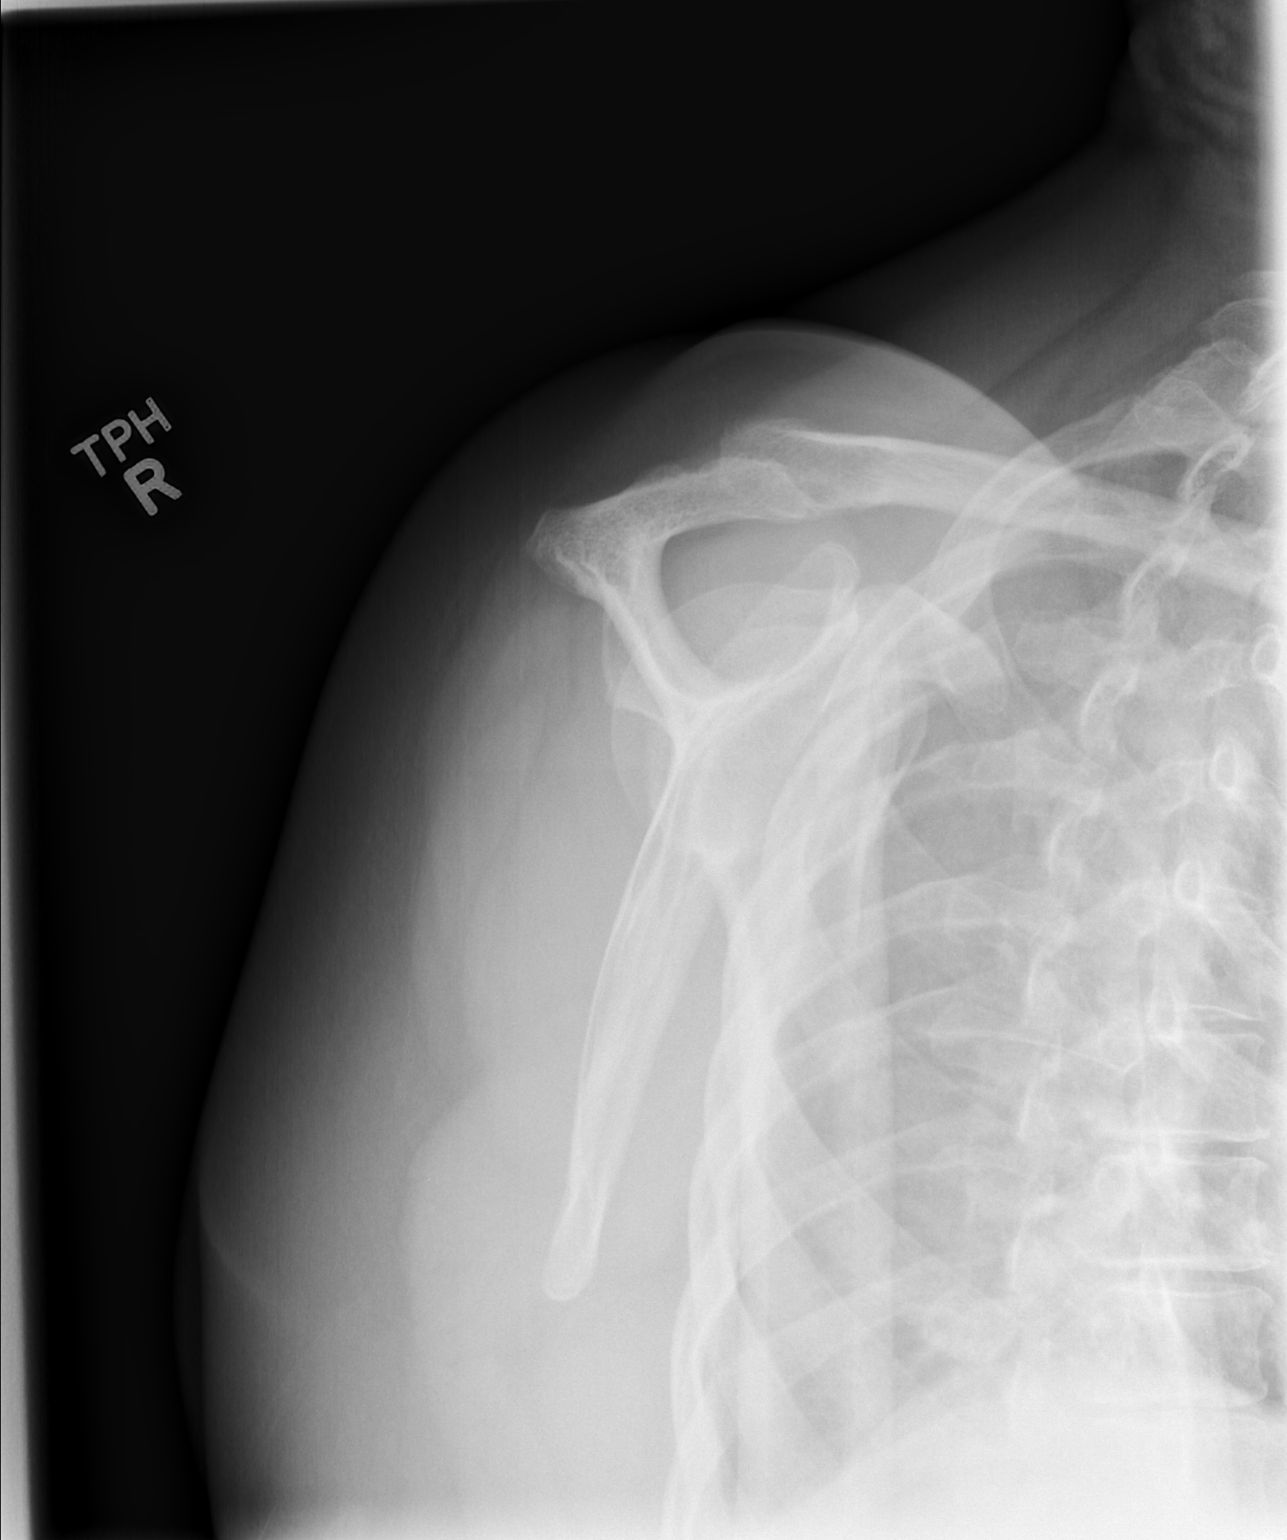

[3 of 3 positions shown; findings below may reference images not displayed]

FINDINGS: There is no evidence of fracture, subluxation or
dislocation.
Mild degenerative changes of the AC joint noted.
No focal bony lesions are identified.
The visualized right bony thorax is unremarkable.
IMPRESSION: No evidence of acute bony abnormality.

Mild AC joint degenerative changes.

## 2014-01-10 ENCOUNTER — Other Ambulatory Visit: Payer: BC Managed Care – PPO

## 2014-01-17 ENCOUNTER — Ambulatory Visit: Payer: BC Managed Care – PPO | Admitting: Family Medicine

## 2014-01-31 ENCOUNTER — Other Ambulatory Visit (INDEPENDENT_AMBULATORY_CARE_PROVIDER_SITE_OTHER): Payer: BC Managed Care – PPO

## 2014-01-31 DIAGNOSIS — R7309 Other abnormal glucose: Secondary | ICD-10-CM

## 2014-01-31 DIAGNOSIS — R7303 Prediabetes: Secondary | ICD-10-CM

## 2014-02-01 LAB — BASIC METABOLIC PANEL
BUN: 13 mg/dL (ref 6–23)
CO2: 28 meq/L (ref 19–32)
Calcium: 9.1 mg/dL (ref 8.4–10.5)
Chloride: 105 mEq/L (ref 96–112)
Creat: 0.81 mg/dL (ref 0.50–1.10)
GLUCOSE: 93 mg/dL (ref 70–99)
POTASSIUM: 4.2 meq/L (ref 3.5–5.3)
SODIUM: 138 meq/L (ref 135–145)

## 2014-02-01 LAB — HEMOGLOBIN A1C
Hgb A1c MFr Bld: 6 % — ABNORMAL HIGH (ref <5.7)
Mean Plasma Glucose: 126 mg/dL — ABNORMAL HIGH (ref <117)

## 2014-02-15 ENCOUNTER — Ambulatory Visit (INDEPENDENT_AMBULATORY_CARE_PROVIDER_SITE_OTHER): Payer: BC Managed Care – PPO | Admitting: Family Medicine

## 2014-02-15 ENCOUNTER — Encounter: Payer: Self-pay | Admitting: Family Medicine

## 2014-02-15 VITALS — BP 110/80 | Temp 97.9°F | Wt 186.0 lb

## 2014-02-15 DIAGNOSIS — R7309 Other abnormal glucose: Secondary | ICD-10-CM

## 2014-02-15 DIAGNOSIS — R7303 Prediabetes: Secondary | ICD-10-CM

## 2014-02-15 NOTE — Progress Notes (Signed)
Pre visit review using our clinic review tool, if applicable. No additional management support is needed unless otherwise documented below in the visit note. 

## 2014-02-15 NOTE — Progress Notes (Signed)
   Subjective:    Patient ID: Janice Williamson, female    DOB: 1955-10-01, 58 y.o.   MRN: [id number]  HPI  Janice Williamson is a 58 year old female who comes in today for evaluation of abnormal blood sugar  She's had this prediabetic condition the past. Recent blood sugar is 93 with an A1c of 6.0%.  She's meticulous about her diet weight 186, she walks 8 miles 3 times weekly the other days 2-3 miles.  She gets routine eye care, dental care, BSE monthly, due for a mammogram, colonoscopy at age 59 normal    Review of Systems    review of systems otherwise negative Objective:   Physical Exam  Well-developed well-nourished female no acute distress vital signs stable she's afebrile blood sugar and A1c normal      Assessment & Plan:  Normal blood sugar,,,,,,,,, continue diet exercise followup in one year

## 2014-02-15 NOTE — Patient Instructions (Signed)
Continue your good diet and exercise followup in one year when necessary

## 2014-06-29 LAB — HM MAMMOGRAPHY

## 2014-07-06 ENCOUNTER — Encounter: Payer: Self-pay | Admitting: Family Medicine

## 2014-07-25 ENCOUNTER — Encounter: Payer: Self-pay | Admitting: Family Medicine

## 2014-11-09 ENCOUNTER — Other Ambulatory Visit (INDEPENDENT_AMBULATORY_CARE_PROVIDER_SITE_OTHER): Payer: BC Managed Care – PPO

## 2014-11-09 DIAGNOSIS — Z0000 Encounter for general adult medical examination without abnormal findings: Secondary | ICD-10-CM

## 2014-11-09 LAB — CBC WITH DIFFERENTIAL/PLATELET
BASOS ABS: 0 10*3/uL (ref 0.0–0.1)
Basophils Relative: 0 % (ref 0–1)
EOS ABS: 0.2 10*3/uL (ref 0.0–0.7)
Eosinophils Relative: 3 % (ref 0–5)
HCT: 39.8 % (ref 36.0–46.0)
Hemoglobin: 12.7 g/dL (ref 12.0–15.0)
Lymphocytes Relative: 43 % (ref 12–46)
Lymphs Abs: 2.4 10*3/uL (ref 0.7–4.0)
MCH: 27.1 pg (ref 26.0–34.0)
MCHC: 31.9 g/dL (ref 30.0–36.0)
MCV: 85 fL (ref 78.0–100.0)
MPV: 9.5 fL (ref 8.6–12.4)
Monocytes Absolute: 0.4 10*3/uL (ref 0.1–1.0)
Monocytes Relative: 8 % (ref 3–12)
Neutro Abs: 2.5 10*3/uL (ref 1.7–7.7)
Neutrophils Relative %: 46 % (ref 43–77)
PLATELETS: 303 10*3/uL (ref 150–400)
RBC: 4.68 MIL/uL (ref 3.87–5.11)
RDW: 14.4 % (ref 11.5–15.5)
WBC: 5.5 10*3/uL (ref 4.0–10.5)

## 2014-11-10 LAB — LIPID PANEL
CHOL/HDL RATIO: 3.1 ratio
Cholesterol: 183 mg/dL (ref 0–200)
HDL: 59 mg/dL (ref >39)
LDL Cholesterol: 112 mg/dL — ABNORMAL HIGH (ref 0–99)
TRIGLYCERIDES: 61 mg/dL (ref <150)
VLDL: 12 mg/dL (ref 0–40)

## 2014-11-10 LAB — URINALYSIS
Bilirubin Urine: NEGATIVE
Glucose, UA: NEGATIVE mg/dL
HGB URINE DIPSTICK: NEGATIVE
Ketones, ur: NEGATIVE mg/dL
Leukocytes, UA: NEGATIVE
NITRITE: NEGATIVE
PH: 6.5 (ref 5.0–8.0)
Protein, ur: NEGATIVE mg/dL
Specific Gravity, Urine: <1.005 — ABNORMAL LOW (ref 1.005–1.030)
UROBILINOGEN UA: 0.2 mg/dL (ref 0.0–1.0)

## 2014-11-10 LAB — BASIC METABOLIC PANEL
BUN: 15 mg/dL (ref 6–23)
CO2: 29 mEq/L (ref 19–32)
CREATININE: 0.78 mg/dL (ref 0.50–1.10)
Calcium: 9.2 mg/dL (ref 8.4–10.5)
Chloride: 104 mEq/L (ref 96–112)
Glucose, Bld: 98 mg/dL (ref 70–99)
POTASSIUM: 4.3 meq/L (ref 3.5–5.3)
Sodium: 140 mEq/L (ref 135–145)

## 2014-11-10 LAB — HEPATIC FUNCTION PANEL
ALK PHOS: 61 U/L (ref 39–117)
ALT: 12 U/L (ref 0–35)
AST: 17 U/L (ref 0–37)
Albumin: 3.9 g/dL (ref 3.5–5.2)
BILIRUBIN DIRECT: 0.1 mg/dL (ref 0.0–0.3)
BILIRUBIN INDIRECT: 0.2 mg/dL (ref 0.2–1.2)
Total Bilirubin: 0.3 mg/dL (ref 0.2–1.2)
Total Protein: 6.8 g/dL (ref 6.0–8.3)

## 2014-11-10 LAB — TSH: TSH: 2.062 u[IU]/mL (ref 0.350–4.500)

## 2014-11-16 ENCOUNTER — Encounter: Payer: Self-pay | Admitting: Family Medicine

## 2014-11-16 ENCOUNTER — Ambulatory Visit (INDEPENDENT_AMBULATORY_CARE_PROVIDER_SITE_OTHER): Payer: BC Managed Care – PPO | Admitting: Family Medicine

## 2014-11-16 VITALS — BP 130/80 | Temp 98.0°F | Ht 64.0 in | Wt 196.0 lb

## 2014-11-16 DIAGNOSIS — Z0000 Encounter for general adult medical examination without abnormal findings: Secondary | ICD-10-CM

## 2014-11-16 NOTE — Progress Notes (Signed)
Pre visit review using our clinic review tool, if applicable. No additional management support is needed unless otherwise documented below in the visit note. 

## 2014-11-16 NOTE — Progress Notes (Signed)
   Subjective:    Patient ID: Janice Williamson, female    DOB: [birth year], 59 y.o.   MRN: [id number]  HPI Janice Williamson is a 59 year old female nonsmoker who comes in today for general physical examination  She's on is been next [employer] he has no chronic health problems.  She gets routine eye care, dental care, BSE monthly, annual mammography, colonoscopy 10 years ago normal. Vaccinations up-to-date except she's due a shingles vaccine. She'll call insurance company and find out where she can get done at.  Social history she is retired from [employer],, now works part-time at [employer]  She exercises 3-4 days per week   Review of Systems  Constitutional: Negative.   HENT: Negative.   Eyes: Negative.   Respiratory: Negative.   Cardiovascular: Negative.   Gastrointestinal: Negative.   Endocrine: Negative.   Genitourinary: Negative.   Musculoskeletal: Negative.   Skin: Negative.   Allergic/Immunologic: Negative.   Neurological: Negative.   Hematological: Negative.   Psychiatric/Behavioral: Negative.        Objective:   Physical Exam  Constitutional: She appears well-developed and well-nourished.  HENT:  Head: Normocephalic and atraumatic.  Right Ear: External ear normal.  Left Ear: External ear normal.  Nose: Nose normal.  Mouth/Throat: Oropharynx is clear and moist.  Eyes: EOM are normal. Pupils are equal, round, and reactive to light.  Neck: Normal range of motion. Neck supple. No JVD present. No tracheal deviation present. No thyromegaly present.  Cardiovascular: Normal rate, regular rhythm, normal heart sounds and intact distal pulses.  Exam reveals no gallop and no friction rub.   No murmur heard. Pulmonary/Chest: Effort normal and breath sounds normal. No stridor. No respiratory distress. She has no wheezes. She has no rales. She exhibits no tenderness.  Abdominal: Soft. Bowel sounds are normal. She exhibits no distension and no mass. There is no tenderness. There is no rebound and no  guarding.  Genitourinary:  Bilateral breast exam normal.  Pelvic and Pap 2 years ago normal will repeat next year asymptomatic  Musculoskeletal: Normal range of motion.  Lymphadenopathy:    She has no cervical adenopathy.  Neurological: She is alert. She has normal reflexes. No cranial nerve deficit. She exhibits normal muscle tone. Coordination normal.  Skin: Skin is warm and dry. No rash noted. No erythema. No pallor.  Psychiatric: She has a normal mood and affect. Her behavior is normal. Judgment and thought content normal.          Assessment & Plan:  Healthy female  Return in one year sooner if any problems

## 2014-11-16 NOTE — Patient Instructions (Signed)
Continue good health habits  Return in one year for general physical examination sooner if any problem

## 2016-02-28 ENCOUNTER — Other Ambulatory Visit: Payer: BC Managed Care – PPO

## 2016-03-06 ENCOUNTER — Encounter: Payer: BC Managed Care – PPO | Admitting: Family Medicine

## 2016-03-07 DIAGNOSIS — H6123 Impacted cerumen, bilateral: Secondary | ICD-10-CM | POA: Insufficient documentation

## 2016-03-07 DIAGNOSIS — H60502 Unspecified acute noninfective otitis externa, left ear: Secondary | ICD-10-CM | POA: Insufficient documentation

## 2016-04-23 ENCOUNTER — Encounter: Payer: Self-pay | Admitting: Internal Medicine

## 2016-04-29 ENCOUNTER — Encounter: Payer: Self-pay | Admitting: Internal Medicine

## 2016-06-25 ENCOUNTER — Ambulatory Visit (AMBULATORY_SURGERY_CENTER): Payer: Self-pay | Admitting: *Deleted

## 2016-06-25 VITALS — Ht 63.5 in | Wt 204.0 lb

## 2016-06-25 DIAGNOSIS — Z1211 Encounter for screening for malignant neoplasm of colon: Secondary | ICD-10-CM

## 2016-06-25 NOTE — Progress Notes (Signed)
No egg or soy allergy known to patient  No issues with past sedation with any surgeries  or procedures, no intubation problems  No diet pills per patient No home 02 use per patient  No blood thinners per patient  Pt denies issues with constipation  No A fib or A flutter   emmi declined'   

## 2016-07-08 ENCOUNTER — Other Ambulatory Visit: Payer: BC Managed Care – PPO

## 2016-07-09 ENCOUNTER — Encounter: Payer: Self-pay | Admitting: Internal Medicine

## 2016-07-09 ENCOUNTER — Ambulatory Visit (AMBULATORY_SURGERY_CENTER): Payer: BLUE CROSS/BLUE SHIELD | Admitting: Internal Medicine

## 2016-07-09 VITALS — BP 134/67 | HR 49 | Temp 95.9°F | Resp 13 | Ht 63.5 in | Wt 204.0 lb

## 2016-07-09 DIAGNOSIS — Z1212 Encounter for screening for malignant neoplasm of rectum: Secondary | ICD-10-CM | POA: Diagnosis not present

## 2016-07-09 DIAGNOSIS — D120 Benign neoplasm of cecum: Secondary | ICD-10-CM | POA: Diagnosis not present

## 2016-07-09 DIAGNOSIS — Z1211 Encounter for screening for malignant neoplasm of colon: Secondary | ICD-10-CM | POA: Diagnosis present

## 2016-07-09 MED ORDER — SODIUM CHLORIDE 0.9 % IV SOLN: 500.0000 mL | INTRAVENOUS | Status: DC

## 2016-07-09 NOTE — Op Note (Signed)
[facility] Patient Name: Janice Williamson Procedure Date: 07/09/2016 11:57 AM MRN: [id number] Endoscopist: Gatha Mayer , MD Age: 60 Referring MD:  Date of Birth: [birth year] Gender: Female Account #: [id number] Procedure:                Colonoscopy Indications:              Screening for colorectal malignant neoplasm, Last                            colonoscopy: 2007 Medicines:                Propofol per Anesthesia, Monitored Anesthesia Care Procedure:                Pre-Anesthesia Assessment:                           - Prior to the procedure, a History and Physical                            was performed, and patient medications and                            allergies were reviewed. The patient's tolerance of                            previous anesthesia was also reviewed. The risks                            and benefits of the procedure and the sedation                            options and risks were discussed with the patient.                            All questions were answered, and informed consent                            was obtained. Prior Anticoagulants: The patient has                            taken no previous anticoagulant or antiplatelet                            agents. ASA Grade Assessment: II - A patient with                            mild systemic disease. After reviewing the risks                            and benefits, the patient was deemed in                            satisfactory condition to undergo the procedure.  After obtaining informed consent, the colonoscope                            was passed under direct vision. Throughout the                            procedure, the patient's blood pressure, pulse, and                            oxygen saturations were monitored continuously. The                            Model CF-HQ190L [id number]) scope was introduced                            through the  anus and advanced to the the cecum,                            identified by appendiceal orifice and ileocecal                            valve. The colonoscopy was performed without                            difficulty. The patient tolerated the procedure                            well. The quality of the bowel preparation was                            good. The bowel preparation used was Miralax. The                            ileocecal valve, appendiceal orifice, and rectum                            were photographed. Scope In: 12:05:18 PM Scope Out: 12:15:35 PM Scope Withdrawal Time: 0 hours 7 minutes 19 seconds  Total Procedure Duration: 0 hours 10 minutes 17 seconds  Findings:                 The perianal and digital rectal examinations were                            normal.                           A 6 mm polyp was found in the cecum. The polyp was                            sessile. The polyp was removed with a cold snare.                            Resection and retrieval were complete. Verification  of patient identification for the specimen was                            done. Estimated blood loss was minimal.                           The exam was otherwise without abnormality on                            direct and retroflexion views. Complications:            No immediate complications. Estimated Blood Loss:     Estimated blood loss was minimal. Impression:               - One 6 mm polyp in the cecum, removed with a cold                            snare. Resected and retrieved.                           - The examination was otherwise normal on direct                            and retroflexion views. Recommendation:           - Patient has a contact number available for                            emergencies. The signs and symptoms of potential                            delayed complications were discussed with the                             patient. Return to normal activities tomorrow.                            Written discharge instructions were provided to the                            patient.                           - Resume previous diet.                           - Continue present medications.                           - Repeat colonoscopy is recommended. The                            colonoscopy date will be determined after pathology                            results from today's exam become available for  review. Gatha Mayer, MD 07/09/2016 12:31:47 PM This report has been signed electronically.

## 2016-07-09 NOTE — Patient Instructions (Addendum)
   I found and removed one small polyp that looks benign.I will let you know pathology results and when to have another routine colonoscopy by mail.  I appreciate the opportunity to care for you. Gatha Mayer, MD, FACG  YOU HAD AN ENDOSCOPIC PROCEDURE TODAY AT [facility]:   Refer to the procedure report that was given to you for any specific questions about what was found during the examination.  If the procedure report does not answer your questions, please call your gastroenterologist to clarify.  If you requested that your care partner not be given the details of your procedure findings, then the procedure report has been included in a sealed envelope for you to review at your convenience later.  YOU SHOULD EXPECT: Some feelings of bloating in the abdomen. Passage of more gas than usual.  Walking can help get rid of the air that was put into your GI tract during the procedure and reduce the bloating. If you had a lower endoscopy (such as a colonoscopy or flexible sigmoidoscopy) you may notice spotting of blood in your stool or on the toilet paper. If you underwent a bowel prep for your procedure, you may not have a normal bowel movement for a few days.  Please Note:  You might notice some irritation and congestion in your nose or some drainage.  This is from the oxygen used during your procedure.  There is no need for concern and it should clear up in a day or so.  SYMPTOMS TO REPORT IMMEDIATELY:   Following lower endoscopy (colonoscopy or flexible sigmoidoscopy):  Excessive amounts of blood in the stool  Significant tenderness or worsening of abdominal pains  Swelling of the abdomen that is new, acute  Fever of 100F or higher  For urgent or emergent issues, a gastroenterologist can be reached at any hour by calling [phone].   DIET:  We do recommend a small meal at first, but then you may proceed to your regular diet.  Drink plenty of fluids but you should  avoid alcoholic beverages for 24 hours.  ACTIVITY:  You should plan to take it easy for the rest of today and you should NOT DRIVE or use heavy machinery until tomorrow (because of the sedation medicines used during the test).    FOLLOW UP: Our staff will call the number listed on your records the next business day following your procedure to check on you and address any questions or concerns that you may have regarding the information given to you following your procedure. If we do not reach you, we will leave a message.  However, if you are feeling well and you are not experiencing any problems, there is no need to return our call.  We will assume that you have returned to your regular daily activities without incident.  If any biopsies were taken you will be contacted by phone or by letter within the next 1-3 weeks.  Please call us at [phone] if you have not heard about the biopsies in 3 weeks.   SIGNATURES/CONFIDENTIALITY: You and/or your care partner have signed paperwork which will be entered into your electronic medical record.  These signatures attest to the fact that that the information above on your After Visit Summary has been reviewed and is understood.  Full responsibility of the confidentiality of this discharge information lies with you and/or your care-partner.  Await pathology  Please read over handout about polyps  Please continue your normal medications

## 2016-07-09 NOTE — Progress Notes (Signed)
Called to room to assist during endoscopic procedure.  Patient ID and intended procedure confirmed with present staff. Received instructions for my participation in the procedure from the performing physician.  

## 2016-07-09 NOTE — Progress Notes (Signed)
To recovery, report to [place], RN, VSS 

## 2016-07-10 ENCOUNTER — Telehealth: Payer: Self-pay

## 2016-07-10 NOTE — Telephone Encounter (Signed)
  Follow up Call-  Call back number 07/09/2016  Post procedure Call Back phone  # [phone]  Permission to leave phone message Yes  Some recent data might be hidden     Patient questions:  Do you have a fever, pain , or abdominal swelling? No. Pain Score  0 *  Have you tolerated food without any problems? Yes.    Have you been able to return to your normal activities? Yes.    Do you have any questions about your discharge instructions: Diet   No. Medications  No. Follow up visit  No.  Do you have questions or concerns about your Care? No.  Actions: * If pain score is 4 or above: No action needed, pain <4.   No problems per the pt. maw

## 2016-07-15 ENCOUNTER — Encounter: Payer: BC Managed Care – PPO | Admitting: Family Medicine

## 2016-07-16 LAB — HM MAMMOGRAPHY: HM Mammogram: NORMAL (ref 0–4)

## 2016-07-19 ENCOUNTER — Other Ambulatory Visit (INDEPENDENT_AMBULATORY_CARE_PROVIDER_SITE_OTHER): Payer: BC Managed Care – PPO

## 2016-07-19 ENCOUNTER — Ambulatory Visit (INDEPENDENT_AMBULATORY_CARE_PROVIDER_SITE_OTHER): Payer: BC Managed Care – PPO

## 2016-07-19 ENCOUNTER — Encounter: Payer: Self-pay | Admitting: Family Medicine

## 2016-07-19 DIAGNOSIS — Z0000 Encounter for general adult medical examination without abnormal findings: Secondary | ICD-10-CM | POA: Diagnosis not present

## 2016-07-19 DIAGNOSIS — Z23 Encounter for immunization: Secondary | ICD-10-CM

## 2016-07-19 LAB — CBC WITH DIFFERENTIAL/PLATELET
BASOS ABS: 0 {cells}/uL (ref 0–200)
BASOS PCT: 0 %
EOS ABS: 147 {cells}/uL (ref 15–500)
Eosinophils Relative: 3 %
HCT: 37.7 % (ref 35.0–45.0)
HEMOGLOBIN: 12.3 g/dL (ref 11.7–15.5)
LYMPHS ABS: 2303 {cells}/uL (ref 850–3900)
Lymphocytes Relative: 47 %
MCH: 27.6 pg (ref 27.0–33.0)
MCHC: 32.6 g/dL (ref 32.0–36.0)
MCV: 84.7 fL (ref 80.0–100.0)
MPV: 9.8 fL (ref 7.5–12.5)
Monocytes Absolute: 392 cells/uL (ref 200–950)
Monocytes Relative: 8 %
NEUTROS ABS: 2058 {cells}/uL (ref 1500–7800)
Neutrophils Relative %: 42 %
Platelets: 321 10*3/uL (ref 140–400)
RBC: 4.45 MIL/uL (ref 3.80–5.10)
RDW: 14.1 % (ref 11.0–15.0)
WBC: 4.9 10*3/uL (ref 3.8–10.8)

## 2016-07-19 LAB — POC URINALSYSI DIPSTICK (AUTOMATED)
Bilirubin, UA: NEGATIVE
Blood, UA: NEGATIVE
Glucose, UA: NEGATIVE
Ketones, UA: NEGATIVE
NITRITE UA: POSITIVE
PROTEIN UA: NEGATIVE
Spec Grav, UA: 1.01
UROBILINOGEN UA: 0.2
pH, UA: 5.5

## 2016-07-19 LAB — TSH: TSH: 2.24 mIU/L

## 2016-07-20 LAB — BASIC METABOLIC PANEL
BUN: 12 mg/dL (ref 7–25)
CHLORIDE: 105 mmol/L (ref 98–110)
CO2: 25 mmol/L (ref 20–31)
Calcium: 8.9 mg/dL (ref 8.6–10.4)
Creat: 0.87 mg/dL (ref 0.50–0.99)
Glucose, Bld: 100 mg/dL — ABNORMAL HIGH (ref 65–99)
POTASSIUM: 4.1 mmol/L (ref 3.5–5.3)
SODIUM: 140 mmol/L (ref 135–146)

## 2016-07-20 LAB — LIPID PANEL
CHOLESTEROL: 201 mg/dL — AB (ref 125–200)
HDL: 59 mg/dL (ref >=46)
LDL Cholesterol: 134 mg/dL — ABNORMAL HIGH (ref <130)
TRIGLYCERIDES: 42 mg/dL (ref <150)
Total CHOL/HDL Ratio: 3.4 Ratio (ref <=5.0)
VLDL: 8 mg/dL (ref <30)

## 2016-07-20 LAB — HEPATIC FUNCTION PANEL
ALT: 14 U/L (ref 6–29)
AST: 17 U/L (ref 10–35)
Albumin: 3.7 g/dL (ref 3.6–5.1)
Alkaline Phosphatase: 62 U/L (ref 33–130)
BILIRUBIN DIRECT: 0.1 mg/dL (ref <=0.2)
Indirect Bilirubin: 0.2 mg/dL (ref 0.2–1.2)
TOTAL PROTEIN: 6.9 g/dL (ref 6.1–8.1)
Total Bilirubin: 0.3 mg/dL (ref 0.2–1.2)

## 2016-07-22 ENCOUNTER — Encounter: Payer: Self-pay | Admitting: Internal Medicine

## 2016-07-22 DIAGNOSIS — Z86010 Personal history of colonic polyps: Secondary | ICD-10-CM

## 2016-07-22 DIAGNOSIS — Z860101 Personal history of adenomatous and serrated colon polyps: Secondary | ICD-10-CM | POA: Insufficient documentation

## 2016-07-22 HISTORY — DX: Personal history of colonic polyps: Z86.010

## 2016-07-22 HISTORY — DX: Personal history of adenomatous and serrated colon polyps: Z86.0101

## 2016-07-22 NOTE — Progress Notes (Signed)
6 mm adenoma Recall 2022

## 2016-07-23 ENCOUNTER — Ambulatory Visit (INDEPENDENT_AMBULATORY_CARE_PROVIDER_SITE_OTHER): Payer: BLUE CROSS/BLUE SHIELD | Admitting: Family Medicine

## 2016-07-23 ENCOUNTER — Other Ambulatory Visit (HOSPITAL_COMMUNITY)
Admission: RE | Admit: 2016-07-23 | Discharge: 2016-07-23 | Disposition: A | Discharge status: Home / Self Care | Payer: BLUE CROSS/BLUE SHIELD | Source: Ambulatory Visit | Attending: Family Medicine | Admitting: Family Medicine

## 2016-07-23 ENCOUNTER — Encounter: Payer: Self-pay | Admitting: Family Medicine

## 2016-07-23 VITALS — BP 136/84 | HR 68 | Temp 98.0°F | Ht 63.0 in | Wt 199.4 lb

## 2016-07-23 DIAGNOSIS — Z0000 Encounter for general adult medical examination without abnormal findings: Secondary | ICD-10-CM

## 2016-07-23 DIAGNOSIS — Z01419 Encounter for gynecological examination (general) (routine) without abnormal findings: Secondary | ICD-10-CM | POA: Insufficient documentation

## 2016-07-23 DIAGNOSIS — R7303 Prediabetes: Secondary | ICD-10-CM | POA: Diagnosis not present

## 2016-07-23 NOTE — Progress Notes (Signed)
Janice Williamson is a 60 year old single female nonsmoker who comes in today for general physical examination  She gets routine eye care, dental care, BSE monthly, annual mammography, colonoscopy October 2017 was normal.  LMP 5 years ago at age 51. No menopausal symptomsst Pap was 2013. She never had a total of Pap smears in the past. Recommend Paps every 3 years. We'll do one today.  Fasting blood sugar 100 and stented 99 that's not significant.  Vaccination history tetanus booster 2013 seasonal flu shot October 2017 information given on shingles vaccine.  Family history mother is alive at 22. Father died at age 62 of heart attack he had hypertension. 4 brothers 5 sisters all in excellent health  She works at [employer] and exercises 5 days a week  Weight is 204 October 17 she's down to 199 now lost 5 pounds in the past 2 weeks with diet. She's on a carbohydrate free diet.  Physical exam HEIGHT 63 INCHES WEIGHT 199 POUNDS. pULSE 68 REGULAR bp 136/84  iN GENERAL SHE IS WELL-DEVELOPED WELL-NOURISHED FEMALE NO ACUTE DISTRESS VITAL SIGNS STABLE SHE'S AFEBRILE AS NOTED ABOVE. heent WERE NEGATIVE NECK WAS SUPPLE NO ADENOPATHY THYROID NORMAL CARDIOPULMONARY EXAM NORMAL BREAST EXAM NORMAL ABDOMINAL EXAM NORMAL pelvic examination external genitalia within normal limits vaginal vault was normal cervix is visualized Pap smear was done bimanual exam negative rectal deferred she does tetanus normal colonoscopy 2 weeks ago. Extremities normal skin normal peripheral pulses normal  Impression #1 healthy female  #2 postmenopausal asymptomatic  #3 overweight...... Lost 6 pounds in 2 weeks with carbohydrate free diet and exercise....... Continue that program.

## 2016-07-23 NOTE — Patient Instructions (Signed)
Continue exercise program........ Consider carbohydrate free diet..... Mediterranean type diet  Follow-up in one year sooner if any problems  Call your insurance company to find a reading get the shingles vaccine the cheapest

## 2016-07-23 NOTE — Progress Notes (Signed)
Pre visit review using our clinic review tool, if applicable. No additional management support is needed unless otherwise documented below in the visit note. 

## 2016-07-26 LAB — CYTOLOGY - PAP
Adequacy: ABSENT
Diagnosis: NEGATIVE

## 2016-10-28 ENCOUNTER — Ambulatory Visit (INDEPENDENT_AMBULATORY_CARE_PROVIDER_SITE_OTHER): Payer: BLUE CROSS/BLUE SHIELD | Admitting: Family Medicine

## 2016-10-28 ENCOUNTER — Encounter: Payer: Self-pay | Admitting: Family Medicine

## 2016-10-28 DIAGNOSIS — M7652 Patellar tendinitis, left knee: Secondary | ICD-10-CM

## 2016-10-28 NOTE — Patient Instructions (Signed)
Motrin 600 mg twice daily with food  Elevation and ice 15 minutes before bedtime  Return when necessary

## 2016-10-28 NOTE — Progress Notes (Signed)
Pre visit review using our clinic review tool, if applicable. No additional management support is needed unless otherwise documented below in the visit note. 

## 2016-10-28 NOTE — Progress Notes (Signed)
Janice Williamson is a 61 year old female nonsmoker comes in today for evaluation of left knee pain  She noted about a month to go some soreness medial side of her left knee. No history of trauma.  She works at [employer]  She describes the discomfort as sharp it comes and goes no precipitating cause  BP 140/90 (BP Location: Left Arm, Patient Position: Sitting, Cuff Size: Normal)   Pulse 63   Temp 97.8 F (36.6 C) (Oral)   Ht 5\' 3"  (1.6 m)   Wt 205 lb 3.4 oz (93.1 kg)   BMI 36.35 kg/m  In general she is a well-developed well-nourished female no acute distress examination of both legs in the sitting position shows both legs to be normal appearance. There is no tenderness or warmth. Ligaments and cartilages are intact.  Tendinitis left knee......... 600 mg of Motrin twice a day elevation and ice return when necessary

## 2016-12-12 ENCOUNTER — Encounter: Payer: Self-pay | Admitting: Family Medicine

## 2016-12-12 ENCOUNTER — Ambulatory Visit (INDEPENDENT_AMBULATORY_CARE_PROVIDER_SITE_OTHER): Payer: BLUE CROSS/BLUE SHIELD | Admitting: Family Medicine

## 2016-12-12 VITALS — BP 148/92 | HR 60 | Temp 97.8°F | Wt 202.6 lb

## 2016-12-12 DIAGNOSIS — M545 Low back pain: Secondary | ICD-10-CM

## 2016-12-12 MED ORDER — PREDNISONE 10 MG PO TABS: ORAL_TABLET | ORAL | 0 refills | Status: DC

## 2016-12-12 MED ORDER — CYCLOBENZAPRINE HCL 10 MG PO TABS: 10.0000 mg | ORAL_TABLET | Freq: Three times a day (TID) | ORAL | 0 refills | Status: DC | PRN

## 2016-12-12 NOTE — Progress Notes (Signed)
Subjective:    Patient ID: Janice Williamson, female    DOB: Sep 21, 1956, 61 y.o.   MRN: [id number]  HPI  BACK PAIN  Location: low back pain Quality: Aching, rated 7 Onset: a week ago Worse with: moving from sitting to standing position     Better with: Nothing     Radiation: No Trauma: None Best sitting/standing/leaning forward: Sitting, standing; pain is associated with movement from sitting to standing.   Red Flags Fecal/urinary incontinence: No Numbness/Weakness: No Fever/chills/sweats: No Night pain: No Unexplained weight loss: No No relief with bedrest: Resting helps h/o cancer/immunosuppression:  No  IV drug use: No PMH of osteoporosis or chronic steroid use: No No treatments have been tried at home.   Review of Systems  Constitutional: Negative for chills and fatigue.  Respiratory: Negative for cough, shortness of breath and wheezing.   Cardiovascular: Negative for chest pain and palpitations.  Gastrointestinal: Negative for abdominal pain, diarrhea, nausea and vomiting.  Genitourinary: Negative for dysuria, flank pain, frequency and urgency.  Musculoskeletal: Positive for back pain. Negative for myalgias.  Neurological: Negative for dizziness, weakness, light-headedness, numbness and headaches.  Psychiatric/Behavioral:       Denies depressed or anxious mood   Past Medical History:  Diagnosis Date  . Allergy   . Cataract    beginning  . Hx of adenomatous polyp of colon 07/22/2016  . Obesity      Social History   Social History  . Marital status: Divorced    Spouse name: N/A  . Number of children: N/A  . Years of education: N/A   Occupational History  . Not on file.   Social History Main Topics  . Smoking status: Never Smoker  . Smokeless tobacco: Never Used  . Alcohol use Yes     Comment: rare- very   . Drug use: No  . Sexual activity: Yes    Birth control/ protection: Post-menopausal, Surgical   Other Topics Concern  . Not on file   Social  History Narrative  . No narrative on file    Past Surgical History:  Procedure Laterality Date  . CESAREAN SECTION    . TUBAL LIGATION    . WISDOM TOOTH EXTRACTION      Family History  Problem Relation Age of Onset  . Hypertension Mother   . Diabetes Sister   . Diabetes Son   . Drug abuse Other   . Diabetes Other   . Colon cancer Neg Hx   . Colon polyps Neg Hx   . Esophageal cancer Neg Hx   . Rectal cancer Neg Hx   . Stomach cancer Neg Hx     Allergies  Allergen Reactions  . Almond Meal Anaphylaxis    Almonds   . Almond Oil   . Penicillins     REACTION: hives    No current outpatient prescriptions on file prior to visit.   Current Facility-Administered Medications on File Prior to Visit  Medication Dose Route Frequency Provider Last Rate Last Dose  . 0.9 %  sodium chloride infusion  500 mL Intravenous Continuous Gatha Mayer, MD        BP (!) 148/92 (BP Location: Left Arm, Patient Position: Sitting, Cuff Size: Normal)   Pulse 60   Temp 97.8 F (36.6 C) (Oral)   Wt 202 lb 9.6 oz (91.9 kg)   SpO2 98%   BMI 35.89 kg/m       Objective:   Physical Exam  Constitutional: She is oriented  to person, place, and time. She appears well-developed and well-nourished.  Eyes: Pupils are equal, round, and reactive to light. No scleral icterus.  Neck: Neck supple.  Cardiovascular: Normal rate, regular rhythm and intact distal pulses.   Pulmonary/Chest: Effort normal and breath sounds normal. She has no wheezes. She has no rales.  Abdominal: Soft. Bowel sounds are normal. There is no tenderness.  Musculoskeletal:  Spine with normal alignment and no deformity. No tenderness to vertebral process with palpation with the exception of tenderness around L4 to L5.  Paraspinous muscles are minimally tender and patient notes this area as painful when moving from a sitting to standing position.Marland Kitchen ROM is full at lumbar sacral regions. Negative Straight Leg raise. No CVA tenderness  present. Able to heel/toe walk without pain.  Lymphadenopathy:    She has no cervical adenopathy.  Neurological: She is alert and oriented to person, place, and time.  Skin: Skin is warm and dry. No rash noted.  Psychiatric: She has a normal mood and affect. Her behavior is normal. Judgment and thought content normal.       Assessment & Plan:  1. Acute low back pain, unspecified back pain laterality, with sciatica presence unspecified Exam is reassuring; cyclobenzaprine prescribed; advised patient that this will cause drowsiness and use this cautiously. Prednisone prescribed; advised to take this medication with food.  Follow up if symptoms do not improve with treatment, worsen, or she develops new symptoms. We discussed the use of PT if symptoms have not resolved. She is interested in this option.  Delano Metz, FNP-C

## 2016-12-12 NOTE — Progress Notes (Signed)
Pre visit review using our clinic review tool, if applicable. No additional management support is needed unless otherwise documented below in the visit note. 

## 2016-12-12 NOTE — Patient Instructions (Addendum)
It was a pleasure to see you today! If pain does not improve with treatment, worsen, or you develop new symptoms, please follow up with your provider. The medicine for back spasms will cause sleepiness. Please use this cautiously as we discussed.   Back Pain, Adult Back pain is very common. The pain often gets better over time. The cause of back pain is usually not dangerous. Most people can learn to manage their back pain on their own. Follow these instructions at home: Watch your back pain for any changes. The following actions may help to lessen any pain you are feeling:  Stay active. Start with short walks on flat ground if you can. Try to walk farther each day.  Exercise regularly as told by your doctor. Exercise helps your back heal faster. It also helps avoid future injury by keeping your muscles strong and flexible.  Do not sit, drive, or stand in one place for more than 30 minutes.  Do not stay in bed. Resting more than 1-2 days can slow down your recovery.  Be careful when you bend or lift an object. Use good form when lifting:  Bend at your knees.  Keep the object close to your body.  Do not twist.  Sleep on a firm mattress. Lie on your side, and bend your knees. If you lie on your back, put a pillow under your knees.  Take medicines only as told by your doctor.  Put ice on the injured area.  Put ice in a plastic bag.  Place a towel between your skin and the bag.  Leave the ice on for 20 minutes, 2-3 times a day for the first 2-3 days. After that, you can switch between ice and heat packs.  Avoid feeling anxious or stressed. Find good ways to deal with stress, such as exercise.  Maintain a healthy weight. Extra weight puts stress on your back. Contact a doctor if:  You have pain that does not go away with rest or medicine.  You have worsening pain that goes down into your legs or buttocks.  You have pain that does not get better in one week.  You have pain  at night.  You lose weight.  You have a fever or chills. Get help right away if:  You cannot control when you poop (bowel movement) or pee (urinate).  Your arms or legs feel weak.  Your arms or legs lose feeling (numbness).  You feel sick to your stomach (nauseous) or throw up (vomit).  You have belly (abdominal) pain.  You feel like you may pass out (faint). This information is not intended to replace advice given to you by your health care provider. Make sure you discuss any questions you have with your health care provider. Document Released: 02/26/2008 Document Revised: 02/15/2016 Document Reviewed: 01/11/2014 Elsevier Interactive Patient Education  2017 [place] NOW OFFER   [facility] FAST TRACK!!!  SAME DAY Appointments for ACUTE CARE  Such as: Sprains, Injuries, cuts, abrasions, rashes, muscle pain, joint pain, back pain Colds, flu, sore throats, headache, allergies, cough, fever  Ear pain, sinus and eye infections Abdominal pain, nausea, vomiting, diarrhea, upset stomach Animal/insect bites  3 Easy Ways to Schedule: Walk-In Scheduling Call in scheduling Mychart Sign-up: [email or web address]

## 2017-01-28 DIAGNOSIS — H9193 Unspecified hearing loss, bilateral: Secondary | ICD-10-CM | POA: Insufficient documentation

## 2017-02-26 ENCOUNTER — Encounter (HOSPITAL_COMMUNITY): Payer: Self-pay | Admitting: Emergency Medicine

## 2017-02-26 ENCOUNTER — Ambulatory Visit (INDEPENDENT_AMBULATORY_CARE_PROVIDER_SITE_OTHER): Payer: BLUE CROSS/BLUE SHIELD

## 2017-02-26 ENCOUNTER — Ambulatory Visit (HOSPITAL_COMMUNITY)
Admission: EM | Admit: 2017-02-26 | Discharge: 2017-02-26 | Disposition: A | Discharge status: Home / Self Care | Payer: BLUE CROSS/BLUE SHIELD | Attending: Internal Medicine | Admitting: Internal Medicine

## 2017-02-26 DIAGNOSIS — S92504A Nondisplaced unspecified fracture of right lesser toe(s), initial encounter for closed fracture: Secondary | ICD-10-CM | POA: Diagnosis not present

## 2017-02-26 NOTE — Discharge Instructions (Signed)
Okay to take 1000 mg of tylenol every 8 ours for pain.  For break through pain take 600 mg of ibuprofen.  Wear you post op shoe at all times during walking. Use ICE as well.

## 2017-02-26 NOTE — ED Provider Notes (Signed)
CSN: [id number]     Arrival date & time 02/26/17  1824 History   None    Chief Complaint  Patient presents with  . Foot Pain   (Consider location/radiation/quality/duration/timing/severity/associated sxs/prior Treatment) HPI   Right fourth toe stubbed into a chase lounge.  Has not tried anything for pain yet.  She can walk however it is somewhat pain.  Feel that the pain is not worse or better.  Denies history of GERD.  Tells me the toenail feels fine. Denies history of diabetes.   Past Medical History:  Diagnosis Date  . Allergy   . Cataract    beginning  . Hx of adenomatous polyp of colon 07/22/2016  . Obesity    Past Surgical History:  Procedure Laterality Date  . CESAREAN SECTION    . TUBAL LIGATION    . WISDOM TOOTH EXTRACTION     Family History  Problem Relation Age of Onset  . Hypertension Mother   . Diabetes Sister   . Diabetes Son   . Drug abuse Other   . Diabetes Other   . Colon cancer Neg Hx   . Colon polyps Neg Hx   . Esophageal cancer Neg Hx   . Rectal cancer Neg Hx   . Stomach cancer Neg Hx    Social History  Substance Use Topics  . Smoking status: Never Smoker  . Smokeless tobacco: Never Used  . Alcohol use Yes     Comment: rare- very    OB History    Gravida Para Term Preterm AB Living   1 1 1     1    SAB TAB Ectopic Multiple Live Births                 Review of Systems  Constitutional: Negative for chills.  Cardiovascular: Negative for leg swelling.  Musculoskeletal: Positive for joint swelling. Negative for arthralgias, back pain, gait problem, myalgias, neck pain and neck stiffness.    Allergies  Almond meal; Almond oil; and Penicillins  Home Medications   Prior to Admission medications   Medication Sig Start Date End Date Taking? Authorizing Provider  Multiple Vitamin (MULTIVITAMIN) capsule Take 1 capsule by mouth daily.   Yes [provider]  OVER THE COUNTER MEDICATION Hair loss_ cortisone injections and an oil both  prescribed by a dermatologist.   Yes [provider]   Meds Ordered and Administered this Visit  Medications - No data to display  BP 125/62 (BP Location: Right Arm) Comment: large cuff  Pulse 66   Temp 98.3 F (36.8 C) (Oral)   Resp 18   SpO2 99%  No data found.  Lab Results  Component Value Date   CREATININE 0.87 07/19/2016     Physical Exam  Constitutional: She is oriented to person, place, and time. She appears well-nourished. No distress.  Eyes: EOM are normal. Pupils are equal, round, and reactive to light.  Cardiovascular: Normal rate.   Pulmonary/Chest: Effort normal.  Abdominal: She exhibits no distension.  Musculoskeletal:       Feet:  Neurological: She is alert and oriented to person, place, and time. No cranial nerve deficit. Gait normal.  Skin: Skin is dry. She is not diaphoretic.  Psychiatric: She has a normal mood and affect.  Vitals reviewed.   Urgent Care Course     Procedures (including critical care time)  Labs Review Labs Reviewed - No data to display  Imaging Review Dg Foot Complete Right  Result Date: 02/26/2017 CLINICAL DATA:  Injury to the right foot pain in the fourth toe EXAM: RIGHT FOOT COMPLETE - 3+ VIEW COMPARISON:  None. FINDINGS: Irregular linear lucencies in the mid and distal shaft of the fourth proximal phalanx consistent with nondisplaced fracture. No definite articular extension. No subluxation. Moderate plantar calcaneal spur. IMPRESSION: Nondisplaced fracture involving the mid and distal shaft of the fourth proximal phalanx Electronically Signed   By: Donavan Foil M.D.   On: 02/26/2017 19:59     MDM   1. Closed nondisplaced fracture of phalanx of lesser toe of right foot, unspecified phalanx, initial encounter    Non displaced.  No need for urgent referral. She will see her PCP in the next few days.    Tereasa Coop, PA-C 02/26/17 2021

## 2017-02-26 NOTE — ED Triage Notes (Signed)
Jammed the toe next to the little toe today in her home.

## 2017-02-28 ENCOUNTER — Ambulatory Visit (INDEPENDENT_AMBULATORY_CARE_PROVIDER_SITE_OTHER): Payer: BLUE CROSS/BLUE SHIELD | Admitting: Family Medicine

## 2017-02-28 ENCOUNTER — Encounter: Payer: Self-pay | Admitting: Family Medicine

## 2017-02-28 DIAGNOSIS — S92501K Displaced unspecified fracture of right lesser toe(s), subsequent encounter for fracture with nonunion: Secondary | ICD-10-CM

## 2017-02-28 NOTE — Progress Notes (Signed)
   Subjective:    Patient ID: Janice Williamson, female    DOB: 1956-01-20, 61 y.o.   MRN: [id number]  HPI Here to follow up an urgent care visit on 02-26-17 for an injury to the right foot. She subbed her toes against a chair leg. Xrays revealed a non-displaced fracture of the proximal phalanx of the right 4th toe. I reviewed the notes and Xray reports with her today. Since then she has been wearing a fracture shoe, staying off her feet, and applying ice. As long as she is off the foot there is little pain, and she no taking any medication for pain.    Review of Systems  Constitutional: Negative.   Respiratory: Negative.   Cardiovascular: Negative.   Musculoskeletal: Positive for arthralgias.       Objective:   Physical Exam  Constitutional: She appears well-developed and well-nourished. No distress.  Cardiovascular: Normal rate, regular rhythm, normal heart sounds and intact distal pulses.   Pulmonary/Chest: Effort normal and breath sounds normal. No respiratory distress. She has no wheezes. She has no rales.  Musculoskeletal:  The right 4th toe is mildly swollen and mildly tender. Alignment is good. The 3rd and 4th toes are buddy wrapped together           Assessment & Plan:  Toe fracture, she is healing as expected. Stay off the foot this weekend. I wrote a note that she can go back to work on 03-03-17 and she will wear a supportive closed toe shoe that day.  Alysia Penna, MD

## 2017-02-28 NOTE — Patient Instructions (Signed)
WE NOW OFFER   [facility] FAST TRACK!!!  SAME DAY Appointments for ACUTE CARE  Such as: Sprains, Injuries, cuts, abrasions, rashes, muscle pain, joint pain, back pain Colds, flu, sore throats, headache, allergies, cough, fever  Ear pain, sinus and eye infections Abdominal pain, nausea, vomiting, diarrhea, upset stomach Animal/insect bites  3 Easy Ways to Schedule: Walk-In Scheduling Call in scheduling Mychart Sign-up: [email or web address]         

## 2017-07-18 ENCOUNTER — Encounter: Payer: Self-pay | Admitting: Family Medicine

## 2017-07-18 ENCOUNTER — Other Ambulatory Visit: Payer: BLUE CROSS/BLUE SHIELD

## 2017-07-28 ENCOUNTER — Encounter: Payer: BLUE CROSS/BLUE SHIELD | Admitting: Family Medicine

## 2017-08-20 ENCOUNTER — Encounter: Payer: Self-pay | Admitting: Family Medicine

## 2017-08-20 ENCOUNTER — Ambulatory Visit (INDEPENDENT_AMBULATORY_CARE_PROVIDER_SITE_OTHER): Payer: BLUE CROSS/BLUE SHIELD | Admitting: Family Medicine

## 2017-08-20 VITALS — BP 128/86 | HR 54 | Temp 98.0°F | Ht 63.0 in | Wt 198.0 lb

## 2017-08-20 DIAGNOSIS — R7303 Prediabetes: Secondary | ICD-10-CM

## 2017-08-20 DIAGNOSIS — Z0000 Encounter for general adult medical examination without abnormal findings: Secondary | ICD-10-CM

## 2017-08-20 LAB — POCT URINALYSIS DIPSTICK
Bilirubin, UA: NEGATIVE
Blood, UA: NEGATIVE
Glucose, UA: NEGATIVE
Ketones, UA: NEGATIVE
LEUKOCYTES UA: NEGATIVE
NITRITE UA: NEGATIVE
PH UA: 6 (ref 5.0–8.0)
PROTEIN UA: NEGATIVE
Spec Grav, UA: 1.02 (ref 1.010–1.025)
UROBILINOGEN UA: 0.2 U/dL

## 2017-08-20 NOTE — Progress Notes (Signed)
Janice Williamson is a 61 year old female nonsmoker who comes in today for general physical examination  She's always been in excellent health she's had no chronic health problems. She has had some mild glucose intolerance however his last A1c was 6.0% and her blood sugars around 90-100.  Marland Kitchen She gets routine eye care......Marland Kitchen recently had both cataracts removed and lens implants, regular dental care, BSE monthly, annual mammography, colonoscopy 2017 showed some polyps. She's due to go back in 5 years  Vaccinations up-to-date. Information given on shingles  Family history unchanged.......... father died unknown cause mother 71 alive and well 9 brothers and sisters all of whom are in good health except one brother had 5 stents recently.  14 point review of systems June otherwise negative  Last pelvic and Pap done October 2017 normal. She's never had trouble with her Paps. Therefore recommend every 3 years  Social history..... Married lives here in [place] works part-time at [employer]  Vs BP 128/86 (BP Location: Left Arm, Patient Position: Sitting, Cuff Size: Normal)   Pulse (!) 54   Temp 98 F (36.7 C) (Oral)   Ht 5\' 3"  (1.6 m)   Wt 198 lb (89.8 kg)   BMI 35.07 kg/m  In general she's well-developed well-nourished female no acute distress examination HEENT were negative except for evidence of bilateral cataract removal and lens imbalance. Neck was supple thyroid is not enlarged no carotid bruits cardiopulmonary exam normal breast exam normal abdominal exam normal pelvic and rectal not indicated extremities normal skin normal peripheral pulses normal except for bilateral cerumen impactions.  #1 healthy female  #2 bilateral cerumen impactions........Marland Kitchen relieved by irrigation  Slightly overweight. She's down 5 pounds since last spring. Advised continue diet exercise and weight loss. ..  ....

## 2017-08-20 NOTE — Patient Instructions (Signed)
Walk 30 minutes daily  Avoid carbohydrates  Labs today........... I will call if his anything abnormal  Check with your pharmacy to find it wearing get the shingles vaccine  Follow-up in one year sooner if any problems

## 2017-08-21 LAB — CBC WITH DIFFERENTIAL/PLATELET
BASOS ABS: 20 {cells}/uL (ref 0–200)
Basophils Relative: 0.4 %
Eosinophils Absolute: 100 cells/uL (ref 15–500)
Eosinophils Relative: 2 %
HEMATOCRIT: 37.3 % (ref 35.0–45.0)
Hemoglobin: 12.3 g/dL (ref 11.7–15.5)
LYMPHS ABS: 2425 {cells}/uL (ref 850–3900)
MCH: 27.5 pg (ref 27.0–33.0)
MCHC: 33 g/dL (ref 32.0–36.0)
MCV: 83.4 fL (ref 80.0–100.0)
MPV: 10.4 fL (ref 7.5–12.5)
Monocytes Relative: 7.6 %
NEUTROS PCT: 41.5 %
Neutro Abs: 2075 cells/uL (ref 1500–7800)
Platelets: 316 10*3/uL (ref 140–400)
RBC: 4.47 10*6/uL (ref 3.80–5.10)
RDW: 12.8 % (ref 11.0–15.0)
TOTAL LYMPHOCYTE: 48.5 %
WBC: 5 10*3/uL (ref 3.8–10.8)
WBCMIX: 380 {cells}/uL (ref 200–950)

## 2017-08-21 LAB — BASIC METABOLIC PANEL
BUN: 11 mg/dL (ref 7–25)
CHLORIDE: 106 mmol/L (ref 98–110)
CO2: 26 mmol/L (ref 20–32)
CREATININE: 0.83 mg/dL (ref 0.50–0.99)
Calcium: 8.9 mg/dL (ref 8.6–10.4)
Glucose, Bld: 99 mg/dL (ref 65–99)
Potassium: 4 mmol/L (ref 3.5–5.3)
Sodium: 142 mmol/L (ref 135–146)

## 2017-08-21 LAB — HEMOGLOBIN A1C
EAG (MMOL/L): 7.1 (calc)
Hgb A1c MFr Bld: 6.1 % of total Hgb — ABNORMAL HIGH (ref <5.7)
Mean Plasma Glucose: 128 (calc)

## 2017-08-21 LAB — LIPID PANEL
CHOL/HDL RATIO: 3.1 (calc) (ref <5.0)
CHOLESTEROL: 202 mg/dL — AB (ref <200)
HDL: 65 mg/dL (ref >50)
LDL CHOLESTEROL (CALC): 123 mg/dL — AB
NON-HDL CHOLESTEROL (CALC): 137 mg/dL — AB (ref <130)
Triglycerides: 47 mg/dL (ref <150)

## 2017-08-21 LAB — HEPATIC FUNCTION PANEL
AG RATIO: 1.3 (calc) (ref 1.0–2.5)
ALBUMIN MSPROF: 3.7 g/dL (ref 3.6–5.1)
ALT: 18 U/L (ref 6–29)
AST: 21 U/L (ref 10–35)
Alkaline phosphatase (APISO): 59 U/L (ref 33–130)
BILIRUBIN DIRECT: 0.1 mg/dL (ref 0.0–0.2)
BILIRUBIN TOTAL: 0.3 mg/dL (ref 0.2–1.2)
GLOBULIN: 2.9 g/dL (ref 1.9–3.7)
Indirect Bilirubin: 0.2 mg/dL (calc) (ref 0.2–1.2)
Total Protein: 6.6 g/dL (ref 6.1–8.1)

## 2017-08-21 LAB — TSH: TSH: 1.37 mIU/L (ref 0.40–4.50)

## 2017-10-06 ENCOUNTER — Ambulatory Visit (INDEPENDENT_AMBULATORY_CARE_PROVIDER_SITE_OTHER): Payer: BLUE CROSS/BLUE SHIELD | Admitting: Family Medicine

## 2017-10-06 ENCOUNTER — Encounter: Payer: Self-pay | Admitting: Family Medicine

## 2017-10-06 VITALS — BP 110/82 | HR 62 | Temp 97.9°F | Wt 202.0 lb

## 2017-10-06 DIAGNOSIS — H918X3 Other specified hearing loss, bilateral: Secondary | ICD-10-CM

## 2017-10-06 DIAGNOSIS — H9190 Unspecified hearing loss, unspecified ear: Secondary | ICD-10-CM | POA: Insufficient documentation

## 2017-10-06 MED ORDER — SCOPOLAMINE 1 MG/3DAYS TD PT72: 1.0000 | MEDICATED_PATCH | TRANSDERMAL | 2 refills | Status: DC

## 2017-10-06 NOTE — Progress Notes (Signed)
Janice Williamson is a delightful 62 year old married female nonsmoker who comes in today for evaluation of bilateral hearing loss  She's had a history of cerumen impactions in the past  She would also like a patch B chest she's going on an ocean cruise  BP 110/82 (BP Location: Left Arm, Patient Position: Sitting, Cuff Size: Normal)   Pulse 62   Temp 97.9 F (36.6 C) (Oral)   Wt 202 lb (91.6 kg)   BMI 35.78 kg/m  Well-developed well-nourished female no acute distress HEENT pertinent his bilateral cerumen impactions  #1 cerumen impaction with bilateral hearing loss............ impaction removed by irrigation and suction

## 2017-10-06 NOTE — Patient Instructions (Addendum)
Transderm-Scop.................Marland Kitchen 1 patch 12 hours prior to your ocean cruise............ then 1 patch every 3 days.  Every 3 months use the ear wax kit.......... one drop in each ear canal bedtime pack with cotton.......Marland Kitchen remove the cotton in the morning.......... after a week of using the drops at bedtime flush both ears with warm water

## 2018-01-02 IMAGING — DX DG FOOT COMPLETE 3+V*R*
3 series · 3 of 3 positions shown · non-contrast
Comparison: None.

CLINICAL DATA: Injury to the right foot pain in the fourth toe

EXAM:
RIGHT FOOT COMPLETE - 3+ VIEW

[foot ap]
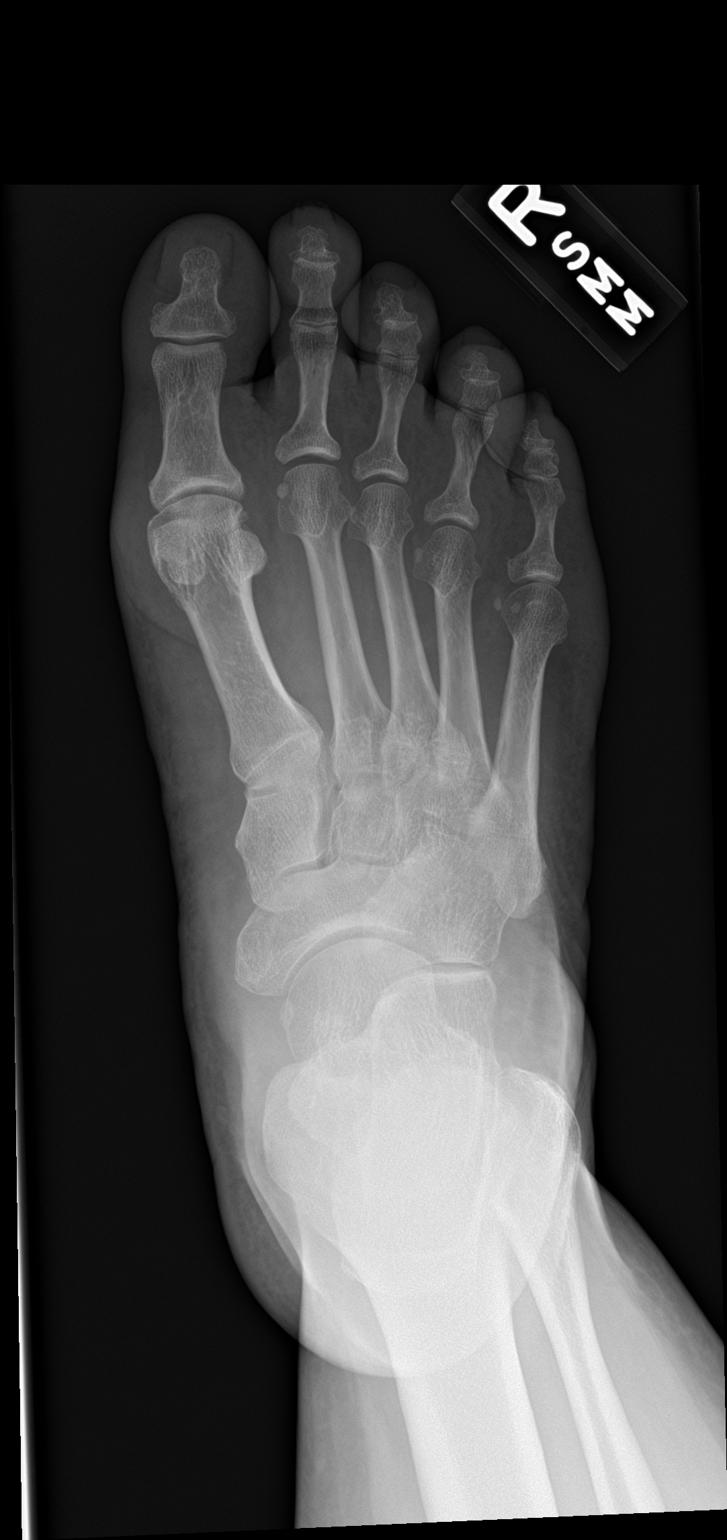

[foot obl]
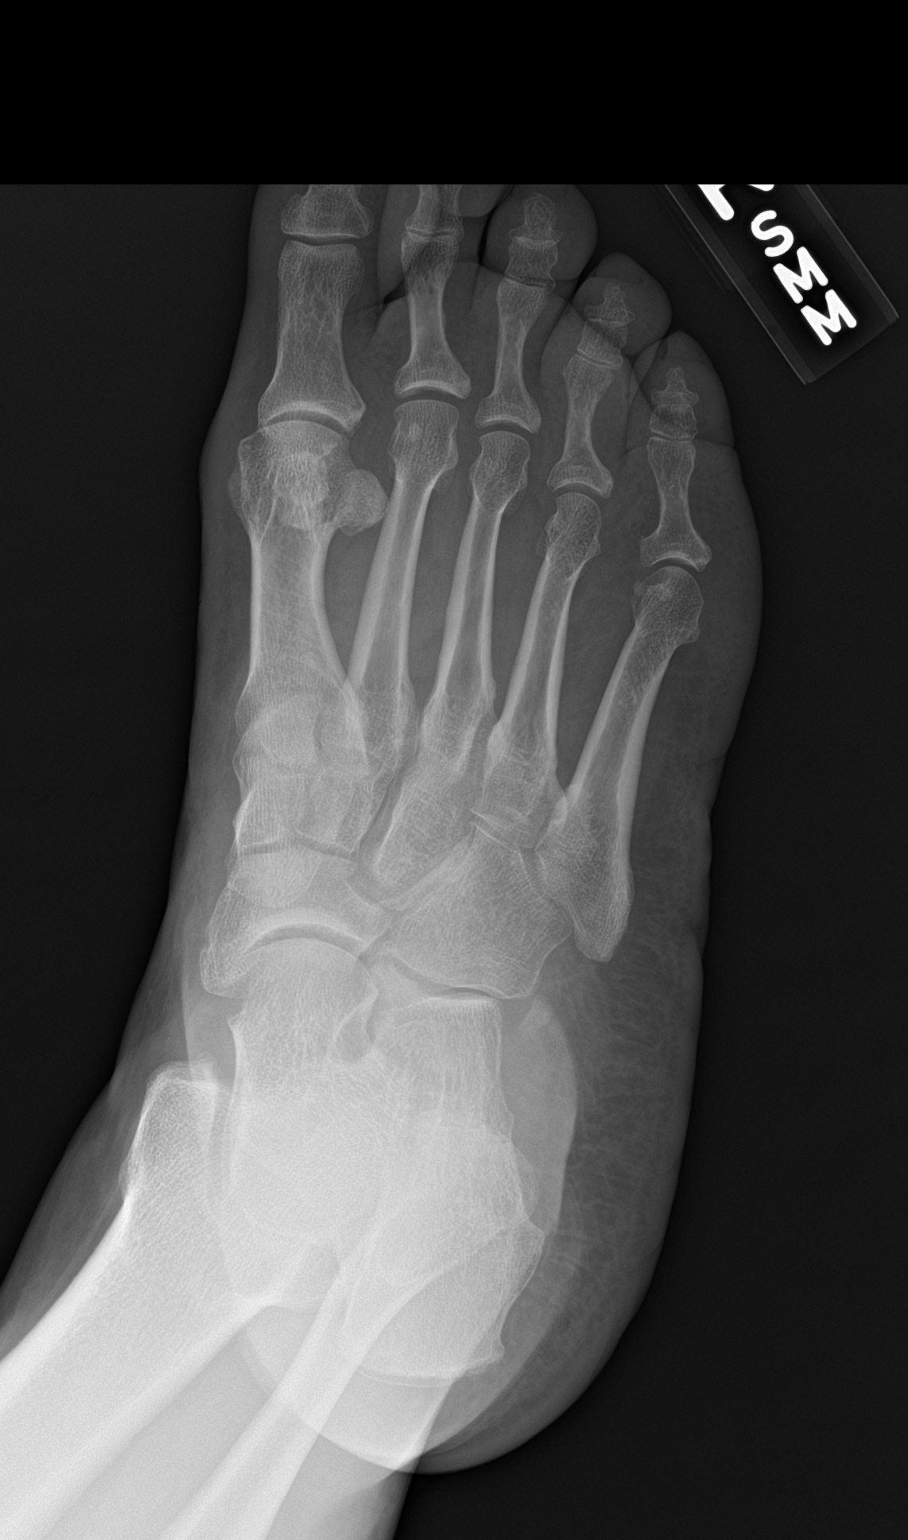

[foot lat]
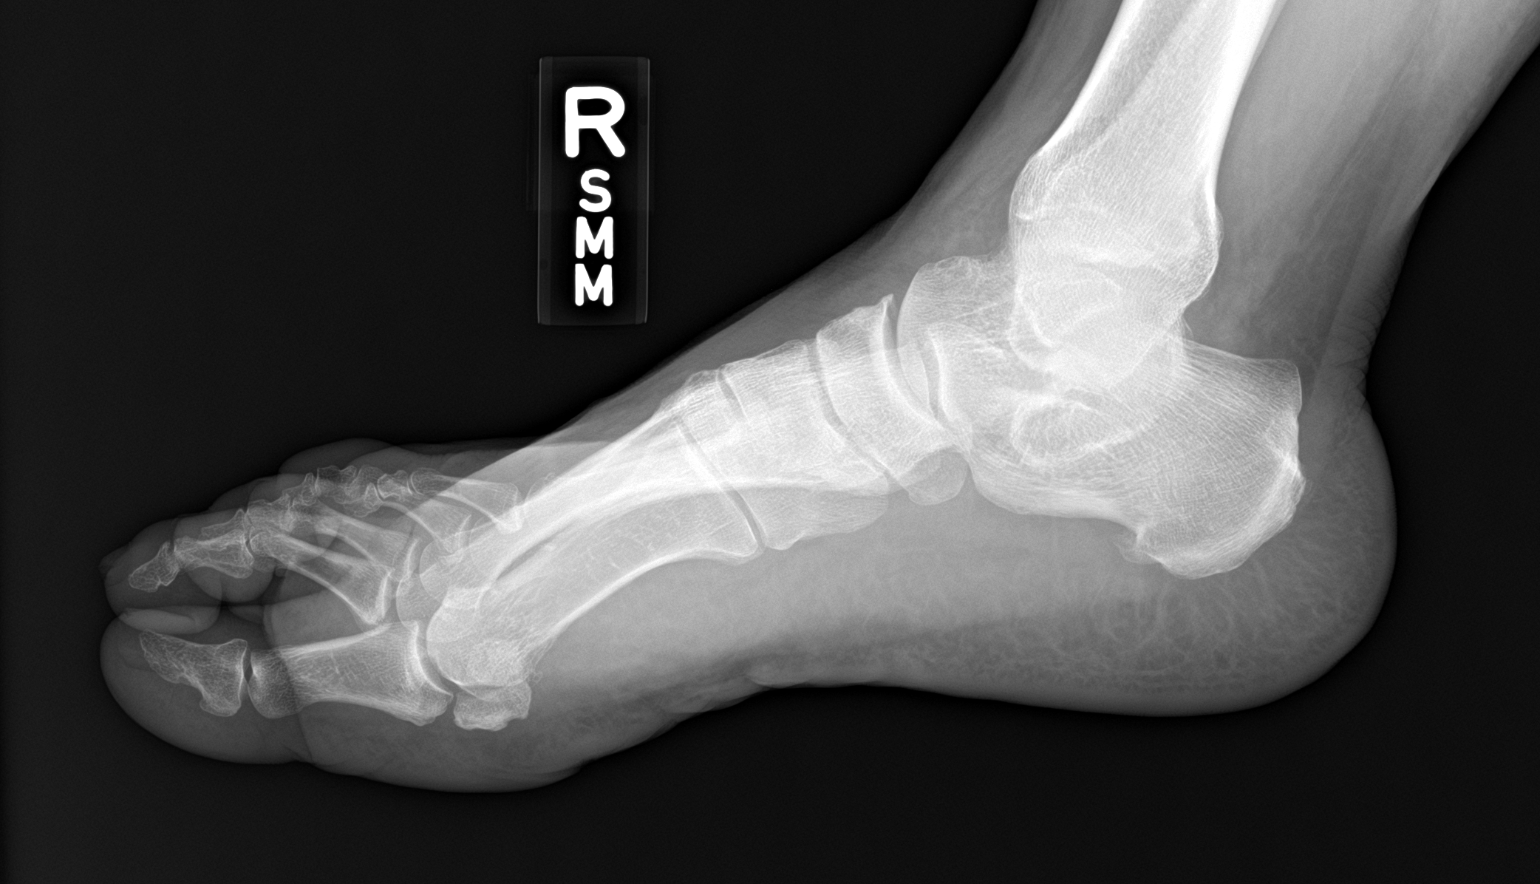

[3 of 3 positions shown; findings below may reference images not displayed]

FINDINGS: Irregular linear lucencies in the mid and distal shaft of the fourth
proximal phalanx consistent with nondisplaced fracture. No definite
articular extension. No subluxation. Moderate plantar calcaneal
spur.
IMPRESSION: Nondisplaced fracture involving the mid and distal shaft of the
fourth proximal phalanx

## 2018-05-29 ENCOUNTER — Ambulatory Visit (INDEPENDENT_AMBULATORY_CARE_PROVIDER_SITE_OTHER): Payer: BLUE CROSS/BLUE SHIELD | Admitting: Internal Medicine

## 2018-05-29 ENCOUNTER — Encounter: Payer: Self-pay | Admitting: Internal Medicine

## 2018-05-29 VITALS — BP 102/70 | HR 66 | Temp 98.1°F | Wt 204.3 lb

## 2018-05-29 DIAGNOSIS — B9689 Other specified bacterial agents as the cause of diseases classified elsewhere: Secondary | ICD-10-CM | POA: Diagnosis not present

## 2018-05-29 DIAGNOSIS — L089 Local infection of the skin and subcutaneous tissue, unspecified: Secondary | ICD-10-CM | POA: Diagnosis not present

## 2018-05-29 DIAGNOSIS — Z880 Allergy status to penicillin: Secondary | ICD-10-CM

## 2018-05-29 DIAGNOSIS — L539 Erythematous condition, unspecified: Secondary | ICD-10-CM

## 2018-05-29 MED ORDER — CLINDAMYCIN HCL 300 MG PO CAPS: 300.0000 mg | ORAL_CAPSULE | Freq: Three times a day (TID) | ORAL | 0 refills | Status: DC

## 2018-05-29 NOTE — Patient Instructions (Addendum)
Don't  think the area is related to your other bites.  Unless   rebitten   And large reaction   treating for skin infection in case .  Bacterial skin infection  Skin germs     Make ROV  appt with dr Sherren Mocha for next week .   I f fever or worsening  concnern then  Contact   On call or emergent care team .

## 2018-05-29 NOTE — Progress Notes (Signed)
Chief Complaint  Patient presents with  . Rash    Rash on R legs. Back in July she was bitten by what she thinks what a horse fly on her right leg. Pt states that since she has been getting random rashes on her back and arm -- all on right side. Itch, painful at times. Stinging sensation. Pt has never had shingles before; has not had vaccine    HPI: Janice Williamson 62 y.o.   SDA PCP NA  See above bite on her right thigh was last month and resolved was itchy and painful with some large swelling.  Then had something on her right antecubital fossa that resolved with hydrocortisone and then right lower extremity 3 bite-like areas that resolved fairly quickly. Yesterday morning she noticed some painful itching and swelling on the right upper back.  Today it is become more painful and swollen without any specific bite trauma fever. She comes in to have it checked.  No history of recurrent skin infections no recent bite noted. She is allergic to penicillin and gets hives with this.  Generally well. ROS: See pertinent positives and negatives per HPI.  Past Medical History:  Diagnosis Date  . Allergy   . Cataract    beginning  . Hx of adenomatous polyp of colon 07/22/2016  . Obesity     Family History  Problem Relation Age of Onset  . Hypertension Mother   . Diabetes Sister   . Diabetes Son   . Drug abuse Other   . Diabetes Other   . Colon cancer Neg Hx   . Colon polyps Neg Hx   . Esophageal cancer Neg Hx   . Rectal cancer Neg Hx   . Stomach cancer Neg Hx     Social History   Socioeconomic History  . Marital status: Divorced    Spouse name: Not on file  . Number of children: Not on file  . Years of education: Not on file  . Highest education level: Not on file  Occupational History  . Not on file  Social Needs  . Financial resource strain: Not on file  . Food insecurity:    Worry: Not on file    Inability: Not on file  . Transportation needs:    Medical: Not on file      Non-medical: Not on file  Tobacco Use  . Smoking status: Never Smoker  . Smokeless tobacco: Never Used  Substance and Sexual Activity  . Alcohol use: Yes    Comment: rare- very   . Drug use: No  . Sexual activity: Yes    Birth control/protection: Post-menopausal, Surgical  Lifestyle  . Physical activity:    Days per week: Not on file    Minutes per session: Not on file  . Stress: Not on file  Relationships  . Social connections:    Talks on phone: Not on file    Gets together: Not on file    Attends religious service: Not on file    Active member of club or organization: Not on file    Attends meetings of clubs or organizations: Not on file    Relationship status: Not on file  Other Topics Concern  . Not on file  Social History Narrative  . Not on file    Outpatient Medications Prior to Visit  Medication Sig Dispense Refill  . Multiple Vitamin (MULTIVITAMIN) capsule Take 1 capsule by mouth daily.    Marland Kitchen OVER THE COUNTER MEDICATION Hair  loss_ cortisone injections and an oil both prescribed by a dermatologist.    . scopolamine (TRANSDERM-SCOP, 1.5 MG,) 1 MG/3DAYS Place 1 patch (1.5 mg total) onto the skin every 3 (three) days. (Patient not taking: Reported on 05/29/2018) 2 patch 2   Facility-Administered Medications Prior to Visit  Medication Dose Route Frequency Provider Last Rate Last Dose  . 0.9 %  sodium chloride infusion  500 mL Intravenous Continuous Gatha Mayer, MD         EXAM:  BP 102/70 (BP Location: Left Arm, Patient Position: Sitting, Cuff Size: Normal)   Pulse 66   Temp 98.1 F (36.7 C) (Oral)   Wt 204 lb 4.8 oz (92.7 kg)   BMI 36.19 kg/m   Body mass index is 36.19 kg/m.  GENERAL: vitals reviewed and listed above, alert, oriented, appears well hydrated and in no acute distress HEENT: atraumatic, conjunctiva  clear, no obvious abnormalities on inspection of external nose and ears NECK: no obvious masses on inspection palpation  Right thigh is  clear right lower extremity shows 3 healed pinpoint areas with minimal hyperpigmentation no swelling or tenderness.  Back shows a couple keloids and right upper back with about a 5+ centimeter irregular red indurated  tender area without center assisting or fluctuance.  No pustules and no vesicles. MS: moves all extremities without noticeable focal  abnormality PSYCH: pleasant and cooperative, no obvious depression or anxiety  ASSESSMENT AND PLAN:  Discussed the following assessment and plan:  Redness of skin - uncertain cause  see text    Localized bacterial skin infection  vs reaction   Hx of allergy to penicillin Uncertain cause of the area concern about infection although I suppose it could be an insect sting but no history of same and no central point.  The area is quite indurated but no fluctuant like abscess to drain and I do not see a cystic entry point. Expectant management empiric antibiotic treatment and close follow-up.    If persistent and progressive. Make follow-up appointment after the weekend with PCP Dr. Sherren Mocha for recheck. Risk benefit of antibiotic more benefit than risk today. -Patient advised to return or notify health care team  if symptoms worsen ,persist or new concerns arise.  Patient Instructions  Don't  think the area is related to your other bites.  Unless   rebitten   And large reaction   treating for skin infection in case .  Bacterial skin infection  Skin germs     Make ROV  appt with dr Sherren Mocha for next week .   I f fever or worsening  concnern then  Contact   On call or emergent care team .    Standley Brooking. Panosh M.D.

## 2018-06-01 ENCOUNTER — Ambulatory Visit (INDEPENDENT_AMBULATORY_CARE_PROVIDER_SITE_OTHER): Payer: BLUE CROSS/BLUE SHIELD | Admitting: Family Medicine

## 2018-06-01 ENCOUNTER — Encounter: Payer: Self-pay | Admitting: Family Medicine

## 2018-06-01 VITALS — BP 124/76 | HR 66 | Temp 98.0°F | Wt 207.3 lb

## 2018-06-01 DIAGNOSIS — R21 Rash and other nonspecific skin eruption: Secondary | ICD-10-CM | POA: Insufficient documentation

## 2018-06-01 NOTE — Progress Notes (Signed)
Janice Williamson is a 62 year old female nonsmoker......Marland Kitchen Works in the Photographer at [employer]..... Who comes in today for follow-up of a skin rash  She was here last week and saw Dr. Tomasita Crumble for evaluation of his skin rash. She had a lesion on her posterior right scapula with Dr. Tomasita Crumble was not sure the etiology. She thought it might be infection so started on Cleocin 3 or milligrams 3 times a day. She's taken this since Friday and hasn't helped. She now has a new lesion on her left forearm which is slightly raised and erythematous.  Review of systems otherwise negative  She seen Dr. Amy Martinique in the past for alopecia  BP 124/76 (BP Location: Right Arm, Patient Position: Sitting, Cuff Size: Large)   Pulse 66   Temp 98 F (36.7 C) (Oral)   Wt 207 lb 5 oz (94 kg)   SpO2 93%   BMI 36.72 kg/m  Well-developed well-nourished female no acute distress vital signs stable she's afebrile examination of back shows an erythematous nodule. She also has a erythematous nodule in her left forearm  #1 skin rash etiology unknown........ Stop the Cleocin........ Consult with dermatology

## 2018-06-01 NOTE — Patient Instructions (Signed)
Stop the Cleocin  Call your dermatologist Dr. Martinique for evaluation ASAP

## 2018-07-21 ENCOUNTER — Ambulatory Visit (INDEPENDENT_AMBULATORY_CARE_PROVIDER_SITE_OTHER): Payer: BLUE CROSS/BLUE SHIELD | Admitting: Family Medicine

## 2018-07-21 ENCOUNTER — Encounter: Payer: Self-pay | Admitting: Family Medicine

## 2018-07-21 VITALS — BP 140/84 | HR 68 | Temp 97.9°F | Resp 12 | Wt 209.8 lb

## 2018-07-21 DIAGNOSIS — R7303 Prediabetes: Secondary | ICD-10-CM | POA: Diagnosis not present

## 2018-07-21 DIAGNOSIS — H6193 Disorder of external ear, unspecified, bilateral: Secondary | ICD-10-CM

## 2018-07-21 DIAGNOSIS — E669 Obesity, unspecified: Secondary | ICD-10-CM | POA: Insufficient documentation

## 2018-07-21 DIAGNOSIS — Z6837 Body mass index (BMI) 37.0-37.9, adult: Secondary | ICD-10-CM

## 2018-07-21 DIAGNOSIS — H6123 Impacted cerumen, bilateral: Secondary | ICD-10-CM | POA: Diagnosis not present

## 2018-07-21 DIAGNOSIS — E663 Overweight: Secondary | ICD-10-CM | POA: Insufficient documentation

## 2018-07-21 NOTE — Patient Instructions (Addendum)
A few things to remember from today's visit:   Bilateral impacted cerumen  Disorder of both external ears  Prediabetes  Today we could not remove all wax from your ear. Please try Debrox 3-4 times per day for 7 days before visit or docusate sodium capsules content 2 to 3 drops in each ear. Avoid Q-tips.  Earwax Buildup, Adult The ears produce a substance called earwax that helps keep bacteria out of the ear and protects the skin in the ear canal. Occasionally, earwax can build up in the ear and cause discomfort or hearing loss. What increases the risk? This condition is more likely to develop in people who:  Are female.  Are elderly.  Naturally produce more earwax.  Clean their ears often with cotton swabs.  Use earplugs often.  Use in-ear headphones often.  Wear hearing aids.  Have narrow ear canals.  Have earwax that is overly thick or sticky.  Have eczema.  Are dehydrated.  Have excess hair in the ear canal.  What are the signs or symptoms? Symptoms of this condition include:  Reduced or muffled hearing.  A feeling of fullness in the ear or feeling that the ear is plugged.  Fluid coming from the ear.  Ear pain.  Ear itch.  Ringing in the ear.  Coughing.  An obvious piece of earwax that can be seen inside the ear canal.  How is this diagnosed? This condition may be diagnosed based on:  Your symptoms.  Your medical history.  An ear exam. During the exam, your health care provider will look into your ear with an instrument called an otoscope.  You may have tests, including a hearing test. How is this treated? This condition may be treated by:  Using ear drops to soften the earwax.  Having the earwax removed by a health care provider. The health care provider may: ? Flush the ear with water. ? Use an instrument that has a loop on the end (curette). ? Use a suction device.  Surgery to remove the wax buildup. This may be done in severe  cases.  Follow these instructions at home:  Take over-the-counter and prescription medicines only as told by your health care provider.  Do not put any objects, including cotton swabs, into your ear. You can clean the opening of your ear canal with a washcloth or facial tissue.  Follow instructions from your health care provider about cleaning your ears. Do not over-clean your ears.  Drink enough fluid to keep your urine clear or pale yellow. This will help to thin the earwax.  Keep all follow-up visits as told by your health care provider. If earwax builds up in your ears often or if you use hearing aids, consider seeing your health care provider for routine, preventive ear cleanings. Ask your health care provider how often you should schedule your cleanings.  If you have hearing aids, clean them according to instructions from the manufacturer and your health care provider. Contact a health care provider if:  You have ear pain.  You develop a fever.  You have blood, pus, or other fluid coming from your ear.  You have hearing loss.  You have ringing in your ears that does not go away.  Your symptoms do not improve with treatment.  You feel like the room is spinning (vertigo). Summary  Earwax can build up in the ear and cause discomfort or hearing loss.  The most common symptoms of this condition include reduced or muffled hearing  and a feeling of fullness in the ear or feeling that the ear is plugged.  This condition may be diagnosed based on your symptoms, your medical history, and an ear exam.  This condition may be treated by using ear drops to soften the earwax or by having the earwax removed by a health care provider.  Do not put any objects, including cotton swabs, into your ear. You can clean the opening of your ear canal with a washcloth or facial tissue. This information is not intended to replace advice given to you by your health care provider. Make sure you  discuss any questions you have with your health care provider. Document Released: 10/17/2004 Document Revised: 11/20/2016 Document Reviewed: 11/20/2016 Elsevier Interactive Patient Education  2018 [employer].   Please be sure medication list is accurate. If a new problem present, please set up appointment sooner than planned today.

## 2018-07-21 NOTE — Progress Notes (Signed)
HPI:   Janice Williamson is a 62 y.o. female, who is here today to establish care.  Former PCP: Dr Sherren Mocha Last preventive routine visit: 07/2017.  Chronic medical problems: HLD  She exercises regularly,walks 3-5 times per week for an hour.  She is not consistent with a healthful diet.  Lab Results  Component Value Date   HGBA1C 6.1 (H) 08/20/2017  Denies abdominal pain, nausea,vomiting, polydipsia,polyuria, or polyphagia.    Concerns today: "itchy ears" for a while,no earache or drainage. She uses Q tips. "Clogged" ears sometimes. Negative for fever,chills,sore throat, nasal congestion,or cough.   Review of Systems  Constitutional: Negative for activity change, appetite change, fatigue and fever.  HENT: Positive for hearing loss. Negative for congestion, ear pain, mouth sores, nosebleeds and trouble swallowing.   Eyes: Negative for redness and visual disturbance.  Respiratory: Negative for cough, shortness of breath and wheezing.   Cardiovascular: Negative for chest pain, palpitations and leg swelling.  Gastrointestinal: Negative for abdominal pain, nausea and vomiting.       Negative for changes in bowel habits.  Genitourinary: Negative for decreased urine volume, difficulty urinating, dysuria and hematuria.  Allergic/Immunologic: Positive for environmental allergies.  Neurological: Negative for syncope, weakness and headaches.      Current Outpatient Medications on File Prior to Visit  Medication Sig Dispense Refill  . Multiple Vitamin (MULTIVITAMIN) capsule Take 1 capsule by mouth daily.     Current Facility-Administered Medications on File Prior to Visit  Medication Dose Route Frequency Provider Last Rate Last Dose  . 0.9 %  sodium chloride infusion  500 mL Intravenous Continuous Gatha Mayer, MD         Past Medical History:  Diagnosis Date  . Allergy   . Cataract    beginning  . Hx of adenomatous polyp of colon 07/22/2016  . Obesity     Allergies  Allergen Reactions  . Almond Meal Anaphylaxis    Almonds   . Almond Oil   . Penicillins     REACTION: hives    Family History  Problem Relation Age of Onset  . Hypertension Mother   . Diabetes Sister   . Diabetes Son   . Drug abuse Other   . Diabetes Other   . Colon cancer Neg Hx   . Colon polyps Neg Hx   . Esophageal cancer Neg Hx   . Rectal cancer Neg Hx   . Stomach cancer Neg Hx     Social History   Socioeconomic History  . Marital status: Divorced    Spouse name: Not on file  . Number of children: Not on file  . Years of education: Not on file  . Highest education level: Not on file  Occupational History  . Not on file  Social Needs  . Financial resource strain: Not on file  . Food insecurity:    Worry: Not on file    Inability: Not on file  . Transportation needs:    Medical: Not on file    Non-medical: Not on file  Tobacco Use  . Smoking status: Never Smoker  . Smokeless tobacco: Never Used  Substance and Sexual Activity  . Alcohol use: Yes    Comment: rare- very   . Drug use: No  . Sexual activity: Yes    Birth control/protection: Post-menopausal, Surgical  Lifestyle  . Physical activity:    Days per week: Not on file    Minutes per session: Not on file  .  Stress: Not on file  Relationships  . Social connections:    Talks on phone: Not on file    Gets together: Not on file    Attends religious service: Not on file    Active member of club or organization: Not on file    Attends meetings of clubs or organizations: Not on file    Relationship status: Not on file  Other Topics Concern  . Not on file  Social History Narrative  . Not on file    Vitals:   07/21/18 0849  BP: 140/84  Pulse: 68  Resp: 12  Temp: 97.9 F (36.6 C)  SpO2: 98%    Body mass index is 37.16 kg/m.   Physical Exam  Nursing note and vitals reviewed. Constitutional: She is oriented to person, place, and time. She appears well-developed. No  distress.  HENT:  Head: Normocephalic and atraumatic.  Right Ear: External ear normal.  Left Ear: External ear normal.  Mouth/Throat: Oropharynx is clear and moist and mucous membranes are normal.  Scaly and dry ear canal, due to cerumen excess I cannot see TM bilaterally.  Eyes: Pupils are equal, round, and reactive to light. Conjunctivae are normal.  Cardiovascular: Normal rate and regular rhythm.  No murmur heard. Pulses:      Dorsalis pedis pulses are 2+ on the right side, and 2+ on the left side.  Respiratory: Effort normal and breath sounds normal. No respiratory distress.  GI: Soft. She exhibits no mass. There is no hepatomegaly. There is no tenderness.  Musculoskeletal: She exhibits no edema.  Lymphadenopathy:    She has no cervical adenopathy.  Neurological: She is alert and oriented to person, place, and time. She has normal strength. No cranial nerve deficit. Gait normal.  Skin: Skin is warm. No rash noted. No erythema.  Psychiatric: She has a normal mood and affect.  Well groomed, good eye contact.     ASSESSMENT AND PLAN:   Janice Williamson was seen today for transitions of care.  Diagnoses and all orders for this visit:  Bilateral impacted cerumen She did not tolerate procedure well in right ear, so we did not do left ear. Recommended OTC Debrox and avoidance of Q tips.  Disorder of both external ears Pruritus most likely related to cerumen impaction.  Prediabetes Healthy life style for primary prevention recommended for primary prevention of DM.  Class 2 severe obesity due to excess calories with serious comorbidity and body mass index (BMI) of 37.0 to 37.9 in adult [facility])  We discussed benefits of wt loss as well as adverse effects of obesity. Consistency with healthy diet and physical activity recommended.     Arad Burston G. Martinique, MD  [facility]. [place] office.

## 2018-07-21 NOTE — Assessment & Plan Note (Signed)
Healthy lifestyle for primary prevention. We will plan on checking A1c again next visit, CPE.

## 2018-07-21 NOTE — Assessment & Plan Note (Signed)
We discussed benefits of wt loss as well as adverse effects of obesity. Consistency with healthy diet and physical activity recommended.  

## 2018-07-25 LAB — HM MAMMOGRAPHY

## 2018-07-27 ENCOUNTER — Encounter: Payer: Self-pay | Admitting: Family Medicine

## 2018-07-30 DIAGNOSIS — L658 Other specified nonscarring hair loss: Secondary | ICD-10-CM | POA: Insufficient documentation

## 2018-07-30 DIAGNOSIS — L669 Cicatricial alopecia, unspecified: Secondary | ICD-10-CM | POA: Insufficient documentation

## 2018-08-24 ENCOUNTER — Ambulatory Visit (INDEPENDENT_AMBULATORY_CARE_PROVIDER_SITE_OTHER): Payer: BLUE CROSS/BLUE SHIELD | Admitting: Family Medicine

## 2018-08-24 ENCOUNTER — Encounter: Payer: Self-pay | Admitting: Family Medicine

## 2018-08-24 VITALS — BP 124/83 | HR 77 | Temp 97.8°F | Resp 12 | Ht 63.0 in | Wt 208.0 lb

## 2018-08-24 DIAGNOSIS — E785 Hyperlipidemia, unspecified: Secondary | ICD-10-CM

## 2018-08-24 DIAGNOSIS — R7303 Prediabetes: Secondary | ICD-10-CM | POA: Diagnosis not present

## 2018-08-24 DIAGNOSIS — Z0000 Encounter for general adult medical examination without abnormal findings: Secondary | ICD-10-CM

## 2018-08-24 DIAGNOSIS — Z1159 Encounter for screening for other viral diseases: Secondary | ICD-10-CM

## 2018-08-24 DIAGNOSIS — Z9189 Other specified personal risk factors, not elsewhere classified: Secondary | ICD-10-CM

## 2018-08-24 LAB — LIPID PANEL
CHOL/HDL RATIO: 4
CHOLESTEROL: 184 mg/dL (ref 0–200)
HDL: 48 mg/dL (ref >39.00)
LDL CALC: 122 mg/dL — AB (ref 0–99)
NonHDL: 135.77
Triglycerides: 67 mg/dL (ref 0.0–149.0)
VLDL: 13.4 mg/dL (ref 0.0–40.0)

## 2018-08-24 LAB — BASIC METABOLIC PANEL
BUN: 14 mg/dL (ref 6–23)
CHLORIDE: 105 meq/L (ref 96–112)
CO2: 27 mEq/L (ref 19–32)
CREATININE: 0.89 mg/dL (ref 0.40–1.20)
Calcium: 8.8 mg/dL (ref 8.4–10.5)
GFR: 81.94 mL/min (ref >60.00)
Glucose, Bld: 124 mg/dL — ABNORMAL HIGH (ref 70–99)
POTASSIUM: 4.1 meq/L (ref 3.5–5.1)
Sodium: 141 mEq/L (ref 135–145)

## 2018-08-24 LAB — HEMOGLOBIN A1C: HEMOGLOBIN A1C: 6.7 % — AB (ref 4.6–6.5)

## 2018-08-24 LAB — TSH: TSH: 2.78 u[IU]/mL (ref 0.35–4.50)

## 2018-08-24 MED ORDER — ZOSTER VAC RECOMB ADJUVANTED 50 MCG/0.5ML IM SUSR: INTRAMUSCULAR | 1 refills | Status: DC

## 2018-08-24 NOTE — Assessment & Plan Note (Signed)
Healthier lifestyle for primary prevention. Further recommendations will be given according to A1c results.

## 2018-08-24 NOTE — Assessment & Plan Note (Signed)
For now she will continue nonpharmacologic treatment. Further recommendation will be given according to lipid panel results as well as 10 years CVD risk score.

## 2018-08-24 NOTE — Patient Instructions (Addendum)
A few things to remember from today's visit:   Routine general medical examination at a health care facility  Prediabetes - Plan: Basic metabolic panel, Hemoglobin A1c  Hyperlipidemia, unspecified hyperlipidemia type - Plan: Lipid panel, TSH  Encounter for HCV screening test for high risk patient - Plan: Hepatitis C antibody  Today you have you routine preventive visit.  At least 150 minutes of moderate exercise per week, daily brisk walking for 15-30 min is a good exercise option. Healthy diet low in saturated (animal) fats and sweets and consisting of fresh fruits and vegetables, lean meats such as fish and white chicken and whole grains.  These are some of recommendations for screening depending of age and risk factors:   - Vaccines:  Tdap vaccine every 10 years.  Shingles vaccine recommended at age 65, could be given after 64 years of age but not sure about insurance coverage.   Pneumonia vaccines:  Prevnar 13 at 65 and Pneumovax at 62. Sometimes Pneumovax is giving earlier if history of smoking, lung disease,diabetes,kidney disease among some.    Screening for diabetes at age 60 and every 3 years.  Cervical cancer prevention:  Pap smear starts at 64 years of age and continues periodically until 64 years old in low risk women. Pap smear every 3 years between 29 and 12 years old. Pap smear every 3-5 years between women 69 and older if pap smear negative and HPV screening negative. Due next year.  -Breast cancer: Mammogram: There is disagreement between experts about when to start screening in low risk asymptomatic female but recent recommendations are to start screening at 31 and not later than 64 years old , every 1-2 years and after 64 yo q 2 years. Screening is recommended until 64 years old but some women can continue screening depending of healthy issues.   Colon cancer screening: starts at 64 years old until 64 years old.   Also recommended:  1. Dental visit-  Brush and floss your teeth twice daily; visit your dentist twice a year. 2. Eye doctor- Get an eye exam at least every 2 years. 3. Helmet use- Always wear a helmet when riding a bicycle, motorcycle, rollerblading or skateboarding. 4. Safe sex- If you may be exposed to sexually transmitted infections, use a condom. 5. Seat belts- Seat belts can save your live; always wear one. 6. Smoke/Carbon Monoxide detectors- These detectors need to be installed on the appropriate level of your home. Replace batteries at least once a year. 7. Skin cancer- When out in the sun please cover up and use sunscreen 15 SPF or higher. 8. Violence- If anyone is threatening or hurting you, please tell your healthcare provider.  9. Drink alcohol in moderation- Limit alcohol intake to one drink or less per day. Never drink and drive.  Please be sure medication list is accurate. If a new problem present, please set up appointment sooner than planned today.

## 2018-08-24 NOTE — Assessment & Plan Note (Addendum)
  We discussed the importance of regular physical activity and healthy diet for prevention of chronic illness and/or complications. Preventive guidelines reviewed. Vaccination, zoster vaccine needed, prescription for Shingrix given. Due fo Pap smear in 2020. Ca++ and vit D supplementation recommended. Next CPE in a year.

## 2018-08-24 NOTE — Progress Notes (Signed)
HPI:   [name] is a 62 y.o. female, who is here today for her routine physical.  Last CPE: 08/20/17  Regular exercise 3 or more time per week: Yes, she walks,does squats and push ups. Following a healthy diet: Not consistently. She lives [place] has family and friends close by. She is also active at church.  Chronic medical problems:   Hyperlipidemia:  Currently on nonpharmacologic treatment. Following a low fat diet: Not consistently.   Lab Results  Component Value Date   CHOL 202 (H) 08/20/2017   HDL 65 08/20/2017   LDLCALC 123 (H) 08/20/2017   TRIG 47 08/20/2017   CHOLHDL 3.1 08/20/2017    Pap smear 07/23/2017, negative Hx of abnormal pap smears: Denies Hx of STD's many years ago, 44-16 yo.   Immunization History  Administered Date(s) Administered  . Influenza Split 07/29/2012  . Influenza, Seasonal, Injecte, Preservative Fre 06/25/2018  . Influenza,inj,Quad PF,6+ Mos 07/19/2016, 06/25/2018  . Influenza-Unspecified 07/15/2014, 06/10/2017  . Tdap 07/29/2012    Mammogram: 07/2018 Colonoscopy: 07/09/16 Hep C screening: Never done.  She has no concerns today.   Review of Systems  Constitutional: Negative for appetite change, fatigue and fever.  HENT: Negative for dental problem, hearing loss, nosebleeds, sore throat, trouble swallowing and voice change.   Eyes: Negative for redness and visual disturbance.  Respiratory: Negative for cough, shortness of breath and wheezing.   Cardiovascular: Negative for chest pain and leg swelling.  Gastrointestinal: Negative for abdominal pain, blood in stool, nausea and vomiting.       No changes in bowel habits.  Endocrine: Negative for cold intolerance, heat intolerance, polydipsia, polyphagia and polyuria.  Genitourinary: Negative for decreased urine volume, dysuria, hematuria, menstrual problem, vaginal bleeding and vaginal discharge.       No breast tenderness or nipple discharge.    Musculoskeletal: Negative for gait problem and myalgias.  Skin: Negative for rash.  Neurological: Negative for syncope, weakness, numbness and headaches.  Hematological: Negative for adenopathy. Does not bruise/bleed easily.  Psychiatric/Behavioral: Negative for confusion and sleep disturbance. The patient is not nervous/anxious.   All other systems reviewed and are negative.     Current Outpatient Medications on File Prior to Visit  Medication Sig Dispense Refill  . betamethasone, augmented, (DIPROLENE) 0.05 % lotion Apply to scalp 3 times per week. Not to face.    . Multiple Vitamin (MULTIVITAMIN) capsule Take 1 capsule by mouth daily.    . NON FORMULARY Take by mouth.     Current Facility-Administered Medications on File Prior to Visit  Medication Dose Route Frequency Provider Last Rate Last Dose  . 0.9 %  sodium chloride infusion  500 mL Intravenous Continuous Gatha Mayer, MD         Past Medical History:  Diagnosis Date  . Allergy   . Cataract    beginning  . Hx of adenomatous polyp of colon 07/22/2016  . Obesity     Past Surgical History:  Procedure Laterality Date  . CESAREAN SECTION    . TUBAL LIGATION    . WISDOM TOOTH EXTRACTION      Allergies  Allergen Reactions  . Almond Meal Anaphylaxis    Almonds   . Almond Oil     Other reaction(s): Other (See Comments)  . Penicillins     REACTION: hives    Family History  Problem Relation Age of Onset  . Hypertension Mother   . Diabetes Sister   . Diabetes Son   .  Drug abuse Other   . Diabetes Other   . Colon cancer Neg Hx   . Colon polyps Neg Hx   . Esophageal cancer Neg Hx   . Rectal cancer Neg Hx   . Stomach cancer Neg Hx     Social History   Socioeconomic History  . Marital status: Divorced    Spouse name: Not on file  . Number of children: Not on file  . Years of education: Not on file  . Highest education level: Not on file  Occupational History  . Not on file  Social Needs  .  Financial resource strain: Not on file  . Food insecurity:    Worry: Not on file    Inability: Not on file  . Transportation needs:    Medical: Not on file    Non-medical: Not on file  Tobacco Use  . Smoking status: Never Smoker  . Smokeless tobacco: Never Used  Substance and Sexual Activity  . Alcohol use: Yes    Comment: rare- very   . Drug use: No  . Sexual activity: Yes    Birth control/protection: Post-menopausal, Surgical  Lifestyle  . Physical activity:    Days per week: Not on file    Minutes per session: Not on file  . Stress: Not on file  Relationships  . Social connections:    Talks on phone: Not on file    Gets together: Not on file    Attends religious service: Not on file    Active member of club or organization: Not on file    Attends meetings of clubs or organizations: Not on file    Relationship status: Not on file  Other Topics Concern  . Not on file  Social History Narrative  . Not on file     Vitals:   08/24/18 0733  BP: 124/83  Pulse: 77  Resp: 12  Temp: 97.8 F (36.6 C)  SpO2: 99%   Body mass index is 36.85 kg/m.   Wt Readings from Last 3 Encounters:  08/24/18 208 lb (94.3 kg)  07/21/18 209 lb 12.8 oz (95.2 kg)  06/01/18 207 lb 5 oz (94 kg)    Physical Exam  Nursing note and vitals reviewed. Constitutional: She is oriented to person, place, and time. She appears well-developed. No distress.  HENT:  Head: Normocephalic and atraumatic.  Right Ear: Hearing and external ear normal.  Left Ear: Hearing and external ear normal.  Mouth/Throat: Uvula is midline, oropharynx is clear and moist and mucous membranes are normal.  Excess cerumen, not able to see the TMs.  Eyes: Pupils are equal, round, and reactive to light. Conjunctivae and EOM are normal.  Neck: No tracheal deviation present. No thyromegaly present.  Cardiovascular: Normal rate and regular rhythm.  No murmur heard. Pulses:      Dorsalis pedis pulses are 2+ on the right  side, and 2+ on the left side.  Respiratory: Effort normal and breath sounds normal. No respiratory distress.  GI: Soft. She exhibits no mass. There is no hepatomegaly. There is no tenderness.  Genitourinary:  Genitourinary Comments: Breast: No masses, skin abnormalities, or nipple discharge appreciated bilateral.   Musculoskeletal: She exhibits no edema.  No synovitis appreciated.  Lymphadenopathy:    She has no cervical adenopathy.       Right: No supraclavicular adenopathy present.       Left: No supraclavicular adenopathy present.  Neurological: She is alert and oriented to person, place, and time. She has normal  strength. No cranial nerve deficit. Coordination and gait normal.  Reflex Scores:      Bicep reflexes are 2+ on the right side and 2+ on the left side.      Patellar reflexes are 2+ on the right side and 2+ on the left side. Skin: Skin is warm. No rash noted. No erythema.  Psychiatric: She has a normal mood and affect. Cognition and memory are normal.  Well groomed, good eye contact.      ASSESSMENT AND PLAN:  [name] was here today annual physical examination.   Orders Placed This Encounter  Procedures  . Basic metabolic panel  . Lipid panel  . TSH  . Hemoglobin A1c  . Hepatitis C antibody    Lab Results  Component Value Date   CREATININE 0.89 08/24/2018   BUN 14 08/24/2018   NA 141 08/24/2018   K 4.1 08/24/2018   CL 105 08/24/2018   CO2 27 08/24/2018   Lab Results  Component Value Date   TSH 2.78 08/24/2018   Lab Results  Component Value Date   CHOL 184 08/24/2018   HDL 48.00 08/24/2018   LDLCALC 122 (H) 08/24/2018   TRIG 67.0 08/24/2018   CHOLHDL 4 08/24/2018   Lab Results  Component Value Date   HGBA1C 6.7 (H) 08/24/2018    Routine general medical examination at a health care facility  We discussed the importance of regular physical activity and healthy diet for prevention of chronic illness and/or  complications. Preventive guidelines reviewed. Vaccination, zoster vaccine needed, prescription for Shingrix given. Due fo Pap smear in 2020. Ca++ and vit D supplementation recommended. Next CPE in a year.  Prediabetes Healthier lifestyle for primary prevention. Further recommendations will be given according to A1c results.  Hyperlipidemia For now she will continue nonpharmacologic treatment. Further recommendation will be given according to lipid panel results as well as 10 years CVD risk score.   Encounter for HCV screening test for high risk patient -     Hepatitis C antibody  Other orders -     Zoster Vaccine Adjuvanted [facility]) injection; 0.5 ml in muscle and repeat in 8 weeks     Return in 1 year (on 08/25/2019) for cpe.      Eddye Broxterman G. Martinique, MD  [facility]. [place] office.

## 2018-08-25 LAB — HEPATITIS C ANTIBODY
Hepatitis C Ab: NONREACTIVE
SIGNAL TO CUT-OFF: 0.01 (ref <1.00)

## 2019-08-02 ENCOUNTER — Encounter: Payer: Self-pay | Admitting: Family Medicine

## 2019-08-30 ENCOUNTER — Encounter: Payer: BC Managed Care – PPO | Admitting: Family Medicine

## 2019-09-08 ENCOUNTER — Encounter: Payer: Self-pay | Admitting: Family Medicine

## 2019-09-08 ENCOUNTER — Ambulatory Visit (INDEPENDENT_AMBULATORY_CARE_PROVIDER_SITE_OTHER): Payer: BC Managed Care – PPO | Admitting: Family Medicine

## 2019-09-08 ENCOUNTER — Other Ambulatory Visit (HOSPITAL_COMMUNITY)
Admission: RE | Admit: 2019-09-08 | Discharge: 2019-09-08 | Disposition: A | Discharge status: Home / Self Care | Payer: BC Managed Care – PPO | Source: Ambulatory Visit | Attending: Family Medicine | Admitting: Family Medicine

## 2019-09-08 ENCOUNTER — Other Ambulatory Visit: Payer: Self-pay

## 2019-09-08 VITALS — BP 130/78 | HR 78 | Resp 12 | Ht 63.0 in | Wt 198.2 lb

## 2019-09-08 DIAGNOSIS — Z124 Encounter for screening for malignant neoplasm of cervix: Secondary | ICD-10-CM | POA: Insufficient documentation

## 2019-09-08 DIAGNOSIS — E1169 Type 2 diabetes mellitus with other specified complication: Secondary | ICD-10-CM | POA: Insufficient documentation

## 2019-09-08 DIAGNOSIS — Z23 Encounter for immunization: Secondary | ICD-10-CM

## 2019-09-08 DIAGNOSIS — Z6835 Body mass index (BMI) 35.0-35.9, adult: Secondary | ICD-10-CM

## 2019-09-08 DIAGNOSIS — E119 Type 2 diabetes mellitus without complications: Secondary | ICD-10-CM

## 2019-09-08 DIAGNOSIS — Z0000 Encounter for general adult medical examination without abnormal findings: Secondary | ICD-10-CM

## 2019-09-08 DIAGNOSIS — E785 Hyperlipidemia, unspecified: Secondary | ICD-10-CM | POA: Diagnosis not present

## 2019-09-08 DIAGNOSIS — E66812 Obesity, class 2: Secondary | ICD-10-CM

## 2019-09-08 LAB — COMPREHENSIVE METABOLIC PANEL
ALT: 15 U/L (ref 0–35)
AST: 18 U/L (ref 0–37)
Albumin: 4 g/dL (ref 3.5–5.2)
Alkaline Phosphatase: 60 U/L (ref 39–117)
BUN: 11 mg/dL (ref 6–23)
CO2: 31 mEq/L (ref 19–32)
Calcium: 9.2 mg/dL (ref 8.4–10.5)
Chloride: 104 mEq/L (ref 96–112)
Creatinine, Ser: 0.79 mg/dL (ref 0.40–1.20)
GFR: 88.17 mL/min (ref >60.00)
Glucose, Bld: 101 mg/dL — ABNORMAL HIGH (ref 70–99)
Potassium: 4 mEq/L (ref 3.5–5.1)
Sodium: 140 mEq/L (ref 135–145)
Total Bilirubin: 0.4 mg/dL (ref 0.2–1.2)
Total Protein: 6.8 g/dL (ref 6.0–8.3)

## 2019-09-08 LAB — LIPID PANEL
Cholesterol: 215 mg/dL — ABNORMAL HIGH (ref 0–200)
HDL: 57.7 mg/dL (ref >39.00)
LDL Cholesterol: 145 mg/dL — ABNORMAL HIGH (ref 0–99)
NonHDL: 157.08
Total CHOL/HDL Ratio: 4
Triglycerides: 60 mg/dL (ref 0.0–149.0)
VLDL: 12 mg/dL (ref 0.0–40.0)

## 2019-09-08 LAB — MICROALBUMIN / CREATININE URINE RATIO
Creatinine,U: 19.6 mg/dL
Microalb Creat Ratio: 3.6 mg/g (ref 0.0–30.0)
Microalb, Ur: <0.7 mg/dL (ref 0.0–1.9)

## 2019-09-08 LAB — HEMOGLOBIN A1C: Hgb A1c MFr Bld: 6.2 % (ref 4.6–6.5)

## 2019-09-08 NOTE — Progress Notes (Signed)
HPI:   Janice Williamson is a 63 y.o. female, who is here today for her routine physical.  Last CPE: 08/24/2018  Regular exercise 3 or more time per week: Walks 2-6 times per week, depending of weather. Active at church , [employer].  Following a healthy diet: [name] is cooking more at home. She lives alone.  Chronic medical problems: DM II,HLD.  Pap smear 06/2016 negative. Hx of abnormal pap smears: Negative. Sexually active.  Hx of STD's: 40+ years ago. M:12 G:1 L:1 A:0  Immunization History  Administered Date(s) Administered  . Fluad Quad(high Dose 65+) 06/10/2019  . Influenza Split 07/29/2012  . Influenza, Seasonal, Injecte, Preservative Fre 06/25/2018  . Influenza,inj,Quad PF,6+ Mos 07/19/2016, 06/25/2018  . Influenza-Unspecified 07/15/2014, 06/10/2017  . Pneumococcal Polysaccharide-23 09/08/2019  . Tdap 07/29/2012  . Zoster Recombinat (Shingrix) 06/10/2019, 09/08/2019    Mammogram: 07/2019  Colonoscopy: 06/2016. DEXA: 2013.  Hep C screening : 08/2018 NR.  She has no concerns today.   DM II: Dx'ed in 08/2018. She is not checking BS's. Negative for polydipsia,polyuria, or polyphagia.  Lab Results  Component Value Date   HGBA1C 6.7 (H) 08/24/2018   HLD:She is on non pharmacologic treatment.  Lab Results  Component Value Date   CHOL 184 08/24/2018   HDL 48.00 08/24/2018   LDLCALC 122 (H) 08/24/2018   TRIG 67.0 08/24/2018   CHOLHDL 4 08/24/2018     Review of Systems  Constitutional: Negative for appetite change, fatigue, fever and unexpected weight change.  HENT: Negative for dental problem, hearing loss, nosebleeds, sore throat and trouble swallowing.   Eyes: Negative for redness and visual disturbance.  Respiratory: Negative for cough, shortness of breath and wheezing.   Cardiovascular: Negative for chest pain and leg swelling.  Gastrointestinal: Negative for abdominal pain, blood in stool, nausea and vomiting.       No  changes in bowel habits.  Endocrine: Negative for cold intolerance and heat intolerance.  Genitourinary: Negative for decreased urine volume, dysuria, hematuria, vaginal bleeding and vaginal discharge.       No breast tenderness or nipple discharge.  Musculoskeletal: Negative for gait problem and myalgias.  Skin: Negative for rash.  Neurological: Negative for syncope, weakness and headaches.  Hematological: Negative for adenopathy. Does not bruise/bleed easily.  Psychiatric/Behavioral: Negative for confusion and sleep disturbance. The patient is not nervous/anxious.   All other systems reviewed and are negative.   Current Outpatient Medications on File Prior to Visit  Medication Sig Dispense Refill  . betamethasone, augmented, (DIPROLENE) 0.05 % lotion Apply to scalp 3 times per week. Not to face.    . Multiple Vitamin (MULTIVITAMIN) capsule Take 1 capsule by mouth daily.    . NON FORMULARY Take by mouth.     Current Facility-Administered Medications on File Prior to Visit  Medication Dose Route Frequency Provider Last Rate Last Admin  . 0.9 %  sodium chloride infusion  500 mL Intravenous Continuous Gatha Mayer, MD         Past Medical History:  Diagnosis Date  . Allergy   . Cataract    beginning  . Hx of adenomatous polyp of colon 07/22/2016  . Obesity     Past Surgical History:  Procedure Laterality Date  . CESAREAN SECTION    . TUBAL LIGATION    . WISDOM TOOTH EXTRACTION      Allergies  Allergen Reactions  . Almond Meal Anaphylaxis    Almonds   . Almond Oil  Other reaction(s): Other (See Comments)  . Penicillins     REACTION: hives    Family History  Problem Relation Age of Onset  . Hypertension Mother   . Diabetes Sister   . Diabetes Son   . Drug abuse Other   . Diabetes Other   . Colon cancer Neg Hx   . Colon polyps Neg Hx   . Esophageal cancer Neg Hx   . Rectal cancer Neg Hx   . Stomach cancer Neg Hx     Social History   Socioeconomic  History  . Marital status: Divorced    Spouse name: Not on file  . Number of children: Not on file  . Years of education: Not on file  . Highest education level: Not on file  Occupational History  . Not on file  Tobacco Use  . Smoking status: Never Smoker  . Smokeless tobacco: Never Used  Substance and Sexual Activity  . Alcohol use: Yes    Comment: rare- very   . Drug use: No  . Sexual activity: Yes    Birth control/protection: Post-menopausal, Surgical  Other Topics Concern  . Not on file  Social History Narrative  . Not on file   Social Determinants of Health   Financial Resource Strain:   . Difficulty of Paying Living Expenses: Not on file  Food Insecurity:   . Worried About Charity fundraiser in the Last Year: Not on file  . Ran Out of Food in the Last Year: Not on file  Transportation Needs:   . Lack of Transportation (Medical): Not on file  . Lack of Transportation (Non-Medical): Not on file  Physical Activity:   . Days of Exercise per Week: Not on file  . Minutes of Exercise per Session: Not on file  Stress:   . Feeling of Stress : Not on file  Social Connections:   . Frequency of Communication with Friends and Family: Not on file  . Frequency of Social Gatherings with Friends and Family: Not on file  . Attends Religious Services: Not on file  . Active Member of Clubs or Organizations: Not on file  . Attends Archivist Meetings: Not on file  . Marital Status: Not on file   Vitals:   09/08/19 0914  BP: 130/78  Pulse: 78  Resp: 12  SpO2: 95%   Body mass index is 35.12 kg/m.   Wt Readings from Last 3 Encounters:  09/08/19 198 lb 4 oz (89.9 kg)  08/24/18 208 lb (94.3 kg)  07/21/18 209 lb 12.8 oz (95.2 kg)    Physical Exam  Nursing note and vitals reviewed. Constitutional: She is oriented to person, place, and time. She appears well-developed. No distress.  HENT:  Head: Normocephalic and atraumatic.  Right Ear: Hearing and external ear  normal.  Left Ear: Hearing and external ear normal.  Mouth/Throat: Uvula is midline, oropharynx is clear and moist and mucous membranes are normal.  Cerumen excess,TM's seen partially bilateral.  Eyes: Pupils are equal, round, and reactive to light. Conjunctivae and EOM are normal.  Neck: No tracheal deviation present. No thyromegaly present.  Cardiovascular: Normal rate and regular rhythm.  No murmur heard. Pulses:      Dorsalis pedis pulses are 2+ on the right side and 2+ on the left side.  Respiratory: Effort normal and breath sounds normal. No respiratory distress.  GI: Soft. She exhibits no mass. There is no hepatomegaly. There is no abdominal tenderness.  Genitourinary: There is no  rash, tenderness or lesion on the right labia. There is no rash, tenderness or lesion on the left labia. Uterus is not enlarged and not tender. Cervix exhibits no motion tenderness, no discharge and no friability. Right adnexum displays no mass, no tenderness and no fullness. Left adnexum displays no mass, no tenderness and no fullness.    No vaginal discharge, erythema, tenderness or bleeding.  No erythema, tenderness or bleeding in the vagina.    No foreign body in the vagina.     No signs of injury in the vagina.     Genitourinary Comments: Breast: No masses, skin abnormalities, or nipple discharge appreciated bilateral. Pap smear collected.   Musculoskeletal:        General: No edema.     Comments: No major deformity or signs of synovitis appreciated.  Lymphadenopathy:    She has no cervical adenopathy.       Right: No inguinal and no supraclavicular adenopathy present.       Left: No inguinal and no supraclavicular adenopathy present.  Neurological: She is alert and oriented to person, place, and time. She has normal strength. No cranial nerve deficit. Coordination and gait normal.  Reflex Scores:      Bicep reflexes are 2+ on the right side and 2+ on the left side.      Patellar reflexes are 2+ on  the right side and 2+ on the left side. Skin: Skin is warm. No rash noted. No erythema.  Psychiatric: She has a normal mood and affect. Cognition and memory are normal.  Well groomed, good eye contact.   ASSESSMENT AND PLAN:  Janice Williamson was here today annual physical examination.   Orders Placed This Encounter  Procedures  . Varicella-zoster vaccine IM (Shingrix)  . Pneumococcal polysaccharide vaccine 23-valent greater than or equal to 2yo subcutaneous/IM  . Microalbumin / creatinine urine ratio  . Comprehensive metabolic panel  . Hemoglobin A1c  . Lipid panel   Lab Results  Component Value Date   MICROALBUR <0.7 09/08/2019   Lab Results  Component Value Date   CHOL 215 (H) 09/08/2019   HDL 57.70 09/08/2019   LDLCALC 145 (H) 09/08/2019   TRIG 60.0 09/08/2019   CHOLHDL 4 09/08/2019   Lab Results  Component Value Date   HGBA1C 6.2 09/08/2019   Lab Results  Component Value Date   CREATININE 0.79 09/08/2019   BUN 11 09/08/2019   NA 140 09/08/2019   K 4.0 09/08/2019   CL 104 09/08/2019   CO2 31 09/08/2019   Lab Results  Component Value Date   ALT 15 09/08/2019   AST 18 09/08/2019   ALKPHOS 60 09/08/2019   BILITOT 0.4 09/08/2019    Routine general medical examination at a health care facility We discussed the importance of regular physical activity and healthy diet for prevention of chronic illness and/or complications. Preventive guidelines reviewed. STD screening done. Vaccination updated.  Next CPE in a year.  Hyperlipidemia, unspecified hyperlipidemia type For now continue nonpharmacologic treatment. Starting medication will be recommended, doses and type of medication will be determined based on lipid panel results.  Diabetes mellitus type II, non insulin dependent ([place]) HgA1C pending. Continue non pharmacologic treatment, recommendations will be given according to HgA1C results. Regular exercise and healthy diet with avoidance of added  sugar food intake is an important part of treatment and recommended. Annual eye exam, periodic dental and foot care recommended. F/U in 5-6 months  -     Microalbumin /  creatinine urine ratio -     Comprehensive metabolic panel -     Hemoglobin A1c  Cervical cancer screening -     Cancel: Cytology - PAP ([place]) -     Cytology - PAP ([place])  Need for shingles vaccine -     Varicella-zoster vaccine IM (Shingrix)  Need for 23-polyvalent pneumococcal polysaccharide vaccine -     Pneumococcal polysaccharide vaccine 23-valent greater than or equal to 2yo subcutaneous/IM  Class 2 severe obesity with serious comorbidity and body mass index (BMI) of 35.0 to 35.9 in adult, unspecified obesity type ([place]) She has lost about 10-11 Lb since 08/2018. Benefits of wt loss: Helps with better controlled glucose and cholesterol as well as with hypertension among others Encouraged to continue regular physical activity, if possible 150 minutes of moderate intenseity exercise per week and a healthful diet.  Consistency with healthy diet and physical activity is important.   Return in 6 months (on 03/08/2020) for DM II,HLD.   Jena Tegeler G. Martinique, MD  [facility]. [place] office.

## 2019-09-08 NOTE — Patient Instructions (Signed)
Today you have you routine preventive visit. A few things to remember from today's visit:   Routine general medical examination at a health care facility  Hyperlipidemia, unspecified hyperlipidemia type - Plan: Lipid panel  Diabetes mellitus type II, non insulin dependent ([place]) - Plan: Microalbumin / creatinine urine ratio, Comprehensive metabolic panel, Hemoglobin A1c  Cervical cancer screening - Plan: Cytology - PAP ([place]), CANCELED: Cytology - PAP ([place])  Need for shingles vaccine - Plan: Varicella-zoster vaccine IM (Shingrix)  Need for 23-polyvalent pneumococcal polysaccharide vaccine - Plan: Pneumococcal polysaccharide vaccine 23-valent greater than or equal to 2yo subcutaneous/IM   At least 150 minutes of moderate exercise per week, daily brisk walking for 15-30 min is a good exercise option. Healthy diet low in saturated (animal) fats and sweets and consisting of fresh fruits and vegetables, lean meats such as fish and white chicken and whole grains.  These are some of recommendations for screening depending of age and risk factors:   - Vaccines:  Tdap vaccine every 10 years.  Shingles vaccine completed today.   Pneumonia vaccines: Pneumovax given today.  Cervical cancer prevention:  Pap smear starts at 65 years of age and continues periodically until 65 years old in low risk women. Pap smear every 3 years between 68 and 65 years old. Pap smear every 3-5 years between women 22 and older if pap smear negative and HPV screening negative.   -Breast cancer: Mammogram: There is disagreement between experts about when to start screening in low risk asymptomatic female but recent recommendations are to start screening at 55 and not later than 65 years old , every 1-2 years and after 65 yo q 2 years. Screening is recommended until 65 years old but some women can continue screening depending of healthy issues.   Colon cancer screening: starts at 65 years old until 65  years old.  Also recommended:  1. Dental visit- Brush and floss your teeth twice daily; visit your dentist twice a year. 2. Eye doctor- Get an eye exam at least every 2 years. 3. Helmet use- Always wear a helmet when riding a bicycle, motorcycle, rollerblading or skateboarding. 4. Safe sex- If you may be exposed to sexually transmitted infections, use a condom. 5. Seat belts- Seat belts can save your live; always wear one. 6. Smoke/Carbon Monoxide detectors- These detectors need to be installed on the appropriate level of your home. Replace batteries at least once a year. 7. Skin cancer- When out in the sun please cover up and use sunscreen 15 SPF or higher. 8. Violence- If anyone is threatening or hurting you, please tell your healthcare provider.  9. Drink alcohol in moderation- Limit alcohol intake to one drink or less per day. Never drink and drive.   Diabetes Mellitus and Nutrition, Adult When you have diabetes (diabetes mellitus), it is very important to have healthy eating habits because your blood sugar (glucose) levels are greatly affected by what you eat and drink. Eating healthy foods in the appropriate amounts, at about the same times every day, can help you:  Control your blood glucose.  Lower your risk of heart disease.  Improve your blood pressure.  Reach or maintain a healthy weight. Every person with diabetes is different, and each person has different needs for a meal plan. Your health care provider may recommend that you work with a diet and nutrition specialist (dietitian) to make a meal plan that is best for you. Your meal plan may vary depending on factors such as:  The calories you need.  The medicines you take.  Your weight.  Your blood glucose, blood pressure, and cholesterol levels.  Your activity level.  Other health conditions you have, such as heart or kidney disease. How do carbohydrates affect me? Carbohydrates, also called carbs, affect your  blood glucose level more than any other type of food. Eating carbs naturally raises the amount of glucose in your blood. Carb counting is a method for keeping track of how many carbs you eat. Counting carbs is important to keep your blood glucose at a healthy level, especially if you use insulin or take certain oral diabetes medicines. It is important to know how many carbs you can safely have in each meal. This is different for every person. Your dietitian can help you calculate how many carbs you should have at each meal and for each snack. Foods that contain carbs include:  Bread, cereal, rice, pasta, and crackers.  Potatoes and corn.  Peas, beans, and lentils.  Milk and yogurt.  Fruit and juice.  Desserts, such as cakes, cookies, ice cream, and candy. How does alcohol affect me? Alcohol can cause a sudden decrease in blood glucose (hypoglycemia), especially if you use insulin or take certain oral diabetes medicines. Hypoglycemia can be a life-threatening condition. Symptoms of hypoglycemia (sleepiness, dizziness, and confusion) are similar to symptoms of having too much alcohol. If your health care provider says that alcohol is safe for you, follow these guidelines:  Limit alcohol intake to no more than 1 drink per day for nonpregnant women and 2 drinks per day for men. One drink equals 12 oz of beer, 5 oz of wine, or 1 oz of hard liquor.  Do not drink on an empty stomach.  Keep yourself hydrated with water, diet soda, or unsweetened iced tea.  Keep in mind that regular soda, juice, and other mixers may contain a lot of sugar and must be counted as carbs. What are tips for following this plan?  Reading food labels  Start by checking the serving size on the "Nutrition Facts" label of packaged foods and drinks. The amount of calories, carbs, fats, and other nutrients listed on the label is based on one serving of the item. Many items contain more than one serving per  package.  Check the total grams (g) of carbs in one serving. You can calculate the number of servings of carbs in one serving by dividing the total carbs by 15. For example, if a food has 30 g of total carbs, it would be equal to 2 servings of carbs.  Check the number of grams (g) of saturated and trans fats in one serving. Choose foods that have low or no amount of these fats.  Check the number of milligrams (mg) of salt (sodium) in one serving. Most people should limit total sodium intake to less than 2,300 mg per day.  Always check the nutrition information of foods labeled as "low-fat" or "nonfat". These foods may be higher in added sugar or refined carbs and should be avoided.  Talk to your dietitian to identify your daily goals for nutrients listed on the label. Shopping  Avoid buying canned, premade, or processed foods. These foods tend to be high in fat, sodium, and added sugar.  Shop around the outside edge of the grocery store. This includes fresh fruits and vegetables, bulk grains, fresh meats, and fresh dairy. Cooking  Use low-heat cooking methods, such as baking, instead of high-heat cooking methods like deep frying.  Cook using healthy oils, such as olive, canola, or sunflower oil.  Avoid cooking with butter, cream, or high-fat meats. Meal planning  Eat meals and snacks regularly, preferably at the same times every day. Avoid going long periods of time without eating.  Eat foods high in fiber, such as fresh fruits, vegetables, beans, and whole grains. Talk to your dietitian about how many servings of carbs you can eat at each meal.  Eat 4-6 ounces (oz) of lean protein each day, such as lean meat, chicken, fish, eggs, or tofu. One oz of lean protein is equal to: ? 1 oz of meat, chicken, or fish. ? 1 egg. ?  cup of tofu.  Eat some foods each day that contain healthy fats, such as avocado, nuts, seeds, and fish. Lifestyle  Check your blood glucose  regularly.  Exercise regularly as told by your health care provider. This may include: ? 150 minutes of moderate-intensity or vigorous-intensity exercise each week. This could be brisk walking, biking, or water aerobics. ? Stretching and doing strength exercises, such as yoga or weightlifting, at least 2 times a week.  Take medicines as told by your health care provider.  Do not use any products that contain nicotine or tobacco, such as cigarettes and e-cigarettes. If you need help quitting, ask your health care provider.  Work with a Social worker or diabetes educator to identify strategies to manage stress and any emotional and social challenges. Questions to ask a health care provider  Do I need to meet with a diabetes educator?  Do I need to meet with a dietitian?  What number can I call if I have questions?  When are the best times to check my blood glucose? Where to find more information:  American Diabetes Association: diabetes.org  Academy of Nutrition and Dietetics: www.eatright.[employer] of Diabetes and Digestive and Kidney Diseases (NIH): [employer] Summary  A healthy meal plan will help you control your blood glucose and maintain a healthy lifestyle.  Working with a diet and nutrition specialist (dietitian) can help you make a meal plan that is best for you.  Keep in mind that carbohydrates (carbs) and alcohol have immediate effects on your blood glucose levels. It is important to count carbs and to use alcohol carefully. This information is not intended to replace advice given to you by your health care provider. Make sure you discuss any questions you have with your health care provider. Document Released: 06/06/2005 Document Revised: 08/22/2017 Document Reviewed: 10/14/2016 Elsevier Patient Education  2020 [employer].

## 2019-09-09 LAB — CYTOLOGY - PAP
Chlamydia: NEGATIVE
Comment: NEGATIVE
Comment: NEGATIVE
Comment: NEGATIVE
Comment: NORMAL
Diagnosis: NEGATIVE
High risk HPV: NEGATIVE
Neisseria Gonorrhea: NEGATIVE
Trichomonas: NEGATIVE

## 2020-10-13 ENCOUNTER — Encounter: Payer: Self-pay | Admitting: Family Medicine

## 2020-10-16 ENCOUNTER — Ambulatory Visit: Payer: Medicare PPO | Admitting: Podiatry

## 2020-10-16 ENCOUNTER — Ambulatory Visit (INDEPENDENT_AMBULATORY_CARE_PROVIDER_SITE_OTHER): Payer: Medicare PPO

## 2020-10-16 ENCOUNTER — Other Ambulatory Visit: Payer: Self-pay

## 2020-10-16 DIAGNOSIS — M722 Plantar fascial fibromatosis: Secondary | ICD-10-CM | POA: Diagnosis not present

## 2020-10-16 DIAGNOSIS — M7662 Achilles tendinitis, left leg: Secondary | ICD-10-CM

## 2020-10-16 DIAGNOSIS — M7732 Calcaneal spur, left foot: Secondary | ICD-10-CM

## 2020-10-25 DIAGNOSIS — M7662 Achilles tendinitis, left leg: Secondary | ICD-10-CM | POA: Diagnosis not present

## 2020-10-25 MED ORDER — BETAMETHASONE SOD PHOS & ACET 6 (3-3) MG/ML IJ SUSP
3.0000 mg | Freq: Once | INTRAMUSCULAR | Status: AC
  Administered 2020-10-25: 3 mg via INTRA_ARTICULAR

## 2020-10-25 NOTE — Progress Notes (Signed)
   HPI: 65 y.o. female presenting today as a new patient for evaluation of left heel pain is been going on for approximately 3-4 weeks now.  She does states she that she has a history of plantar fasciitis.  She believes that there is a flareup and it hurts in the back of the heel.  She denies history of trauma or injury to the area.  She presents for further treatment evaluation    Past Medical History:  Diagnosis Date  . Allergy   . Cataract    beginning  . Hx of adenomatous polyp of colon 07/22/2016  . Obesity       Physical Exam: General: The patient is alert and oriented x3 in no acute distress.  Dermatology: Skin is warm, dry and supple bilateral lower extremities. Negative for open lesions or macerations.  Vascular: Palpable pedal pulses bilaterally. No edema or erythema noted. Capillary refill within normal limits.  Neurological: Epicritic and protective threshold grossly intact bilaterally.   Musculoskeletal Exam: Pain on palpation noted to the posterior tubercle of the left calcaneus at the insertion of the Achilles tendon consistent with retrocalcaneal bursitis. Range of motion within normal limits. Muscle strength 5/5 in all muscle groups bilateral lower extremities.  Radiographic Exam:  Posterior calcaneal spur noted to the respective calcaneus on lateral view. No fracture or dislocation noted. Normal osseous mineralization noted.     Assessment: 1. Insertional Achilles tendinitis left 2. Retrocalcaneal bursitis   Plan of Care:  1. Patient was evaluated. Radiographs were reviewed today. 2. Injection of 0.5 mL Celestone Soluspan injected into the retrocalcaneal bursa. Care was taken to avoid direct injection into the Achilles tendon. 3.  Patient declined any oral anti-inflammatory medication 4.  Recommend daily stretching exercises 5.  Recommend good supportive shoes that do not irritate the posterior heel 6.  Return to clinic as needed  *Retired   Edrick Kins, DPM [facility]  Dr. Edrick Kins, DPM    [address], [place] [address]                Office [phone]  Fax [phone]

## 2020-12-05 ENCOUNTER — Encounter: Payer: BC Managed Care – PPO | Admitting: Family Medicine

## 2020-12-21 ENCOUNTER — Other Ambulatory Visit: Payer: Self-pay

## 2020-12-22 ENCOUNTER — Ambulatory Visit (INDEPENDENT_AMBULATORY_CARE_PROVIDER_SITE_OTHER): Payer: Medicare PPO | Admitting: Family Medicine

## 2020-12-22 ENCOUNTER — Encounter: Payer: Self-pay | Admitting: Family Medicine

## 2020-12-22 VITALS — BP 128/70 | HR 73 | Resp 16 | Ht 63.0 in | Wt 185.0 lb

## 2020-12-22 DIAGNOSIS — E119 Type 2 diabetes mellitus without complications: Secondary | ICD-10-CM | POA: Diagnosis not present

## 2020-12-22 DIAGNOSIS — Z780 Asymptomatic menopausal state: Secondary | ICD-10-CM

## 2020-12-22 DIAGNOSIS — Z0000 Encounter for general adult medical examination without abnormal findings: Secondary | ICD-10-CM

## 2020-12-22 DIAGNOSIS — E785 Hyperlipidemia, unspecified: Secondary | ICD-10-CM

## 2020-12-22 DIAGNOSIS — Z1211 Encounter for screening for malignant neoplasm of colon: Secondary | ICD-10-CM

## 2020-12-22 LAB — MICROALBUMIN / CREATININE URINE RATIO
Creatinine,U: 129.7 mg/dL
Microalb Creat Ratio: 0.5 mg/g (ref 0.0–30.0)
Microalb, Ur: <0.7 mg/dL (ref 0.0–1.9)

## 2020-12-22 LAB — COMPREHENSIVE METABOLIC PANEL
ALT: 10 U/L (ref 0–35)
AST: 15 U/L (ref 0–37)
Albumin: 4 g/dL (ref 3.5–5.2)
Alkaline Phosphatase: 55 U/L (ref 39–117)
BUN: 12 mg/dL (ref 6–23)
CO2: 29 mEq/L (ref 19–32)
Calcium: 9.3 mg/dL (ref 8.4–10.5)
Chloride: 105 mEq/L (ref 96–112)
Creatinine, Ser: 0.84 mg/dL (ref 0.40–1.20)
GFR: 72.1 mL/min (ref >60.00)
Glucose, Bld: 97 mg/dL (ref 70–99)
Potassium: 4.2 mEq/L (ref 3.5–5.1)
Sodium: 141 mEq/L (ref 135–145)
Total Bilirubin: 0.4 mg/dL (ref 0.2–1.2)
Total Protein: 6.7 g/dL (ref 6.0–8.3)

## 2020-12-22 LAB — LIPID PANEL
Cholesterol: 201 mg/dL — ABNORMAL HIGH (ref 0–200)
HDL: 57.1 mg/dL (ref >39.00)
LDL Cholesterol: 134 mg/dL — ABNORMAL HIGH (ref 0–99)
NonHDL: 144.38
Total CHOL/HDL Ratio: 4
Triglycerides: 51 mg/dL (ref 0.0–149.0)
VLDL: 10.2 mg/dL (ref 0.0–40.0)

## 2020-12-22 LAB — HEMOGLOBIN A1C: Hgb A1c MFr Bld: 6.1 % (ref 4.6–6.5)

## 2020-12-22 NOTE — Patient Instructions (Addendum)
Today you have you routine preventive visit. A few things to remember from today's visit:   Routine general medical examination at a health care facility  Hyperlipidemia, unspecified hyperlipidemia type - Plan: Lipid panel  Diabetes mellitus type II, non insulin dependent ([place]) - Plan: Comprehensive metabolic panel, Hemoglobin A1c, Microalbumin / creatinine urine ratio  Asymptomatic postmenopausal estrogen deficiency - Plan: DEXAScan  Colon cancer screening - Plan: Ambulatory referral to Gastroenterology  Please be sure medication list is accurate. If a new problem present, please set up appointment sooner than planned today.  At least 150 minutes of moderate exercise per week, daily brisk walking for 15-30 min is a good exercise option. Healthy diet low in saturated (animal) fats and sweets and consisting of fresh fruits and vegetables, lean meats such as fish and white chicken and whole grains.  These are some of recommendations for screening depending of age and risk factors:  - Vaccines:  Tdap vaccine every 10 years.  Shingles vaccine recommended at age 39, could be given after 67 years of age but not sure about insurance coverage.   Pneumonia vaccines: Pneumovax at 37. Sometimes Pneumovax is giving earlier if history of smoking, lung disease,diabetes,kidney disease among some. Completed.  Screening for diabetes at age 16 and every 3 years.N/A  Cervical cancer prevention:  Pap smear starts at 67 years of age and continues periodically until 67 years old in low risk women. Pap smear every 3 years between 24 and 47 years old. Pap smear every 3-5 years between women 49 and older if pap smear negative and HPV screening negative.   -Breast cancer: Mammogram: There is disagreement between experts about when to start screening in low risk asymptomatic female but recent recommendations are to start screening at 59 and not later than 67 years old , every 1-2 years and after 67 yo q 2  years. Screening is recommended until 67 years old but some women can continue screening depending of healthy issues.  Colon cancer screening: Has been recently changed to 67 yo. Insurance may not cover until you are 68 years old. Screening is recommended until 67 years old.  Cholesterol disorder screening at age 22 and every 3 years.  Also recommended:  1. Dental visit- Brush and floss your teeth twice daily; visit your dentist twice a year. 2. Eye doctor- Get an eye exam at least every 2 years. 3. Helmet use- Always wear a helmet when riding a bicycle, motorcycle, rollerblading or skateboarding. 4. Safe sex- If you may be exposed to sexually transmitted infections, use a condom. 5. Seat belts- Seat belts can save your live; always wear one. 6. Smoke/Carbon Monoxide detectors- These detectors need to be installed on the appropriate level of your home. Replace batteries at least once a year. 7. Skin cancer- When out in the sun please cover up and use sunscreen 15 SPF or higher. 8. Violence- If anyone is threatening or hurting you, please tell your healthcare provider.  9. Drink alcohol in moderation- Limit alcohol intake to one drink or less per day. Never drink and drive. 10. Calcium supplementation 1000 to 1200 mg daily, ideally through your diet.  Vitamin D supplementation 800 units daily.

## 2020-12-22 NOTE — Progress Notes (Signed)
HPI: Janice Williamson is a 65 y.o. female, who is here today for her routine physical.  Last CPE: 09/08/19. Since her last visit she has seen dermatologist,ENT,and podiatrist.  Regular exercise 3 or more time per week: Walking 5 miles 6 times per week. Following a healthy diet: Yes, cooks at home. 08/2020 she started fasting for religious reasons. She continues avoiding sweets and meat. She has lost some wt.  She lives alone. She is her mother's caregiver.  Chronic medical problems: HLD,DM II,depression,and insomnia among some.  Pap smear 09/08/19 negative. Hx of abnormal pap smears: Negative.  Hx of STD's: 40+ years ago. M:12 G:1 L:1 A:0  Immunization History  Administered Date(s) Administered  . Fluad Quad(high Dose 65+) 06/10/2019  . Influenza Split 07/29/2012  . Influenza, Seasonal, Injecte, Preservative Fre 06/25/2018  . Influenza,inj,Quad PF,6+ Mos 07/19/2016, 06/25/2018  . Influenza-Unspecified 07/15/2014, 06/10/2017  . Pneumococcal Polysaccharide-23 09/08/2019  . Tdap 07/29/2012  . Zoster Recombinat (Shingrix) 06/10/2019, 09/08/2019  Reporting getting Prevnar 13 yesterday.  Mammogram: 08/07/20, Bi-Rads 1 Colonoscopy: 07/09/16, 5 years f/u was recommended. DEXA: 04/2012.  Hep C screening: 08/24/18 NR.  Follow up:  HLD:She is not on pharmacologic treatment.  Lab Results  Component Value Date   CHOL 215 (H) 09/08/2019   HDL 57.70 09/08/2019   LDLCALC 145 (H) 09/08/2019   TRIG 60.0 09/08/2019   CHOLHDL 4 09/08/2019   DM II: Dx'ed in 08/2018. She is on non pharmacologic treatment. She does not check BS's. Negative for polydipsia,polyuria, or polyphagia.  Lab Results  Component Value Date   HGBA1C 6.2 09/08/2019   Lab Results  Component Value Date   MICROALBUR <0.7 09/08/2019   Last eye exam 01/2020.  Review of Systems  Constitutional: Negative for appetite change, fatigue and fever.  HENT: Negative for hearing loss, mouth sores and sore  throat.   Eyes: Negative for redness and visual disturbance.  Respiratory: Negative for cough, shortness of breath and wheezing.   Cardiovascular: Negative for chest pain and leg swelling.  Gastrointestinal: Negative for abdominal pain, nausea and vomiting.       No changes in bowel habits.  Endocrine: Negative for cold intolerance and heat intolerance.  Genitourinary: Negative for decreased urine volume, dysuria, hematuria, vaginal bleeding and vaginal discharge.  Musculoskeletal: Negative for gait problem and myalgias.  Skin: Negative for color change and rash.  Allergic/Immunologic: Positive for environmental allergies.  Neurological: Negative for syncope, weakness and headaches.  Hematological: Negative for adenopathy. Does not bruise/bleed easily.  Psychiatric/Behavioral: Negative for behavioral problems and confusion.  All other systems reviewed and are negative.   Current Outpatient Medications on File Prior to Visit  Medication Sig Dispense Refill  . betamethasone, augmented, (DIPROLENE) 0.05 % lotion Apply to scalp 3 times per week. Not to face.    . minoxidil (LONITEN) 2.5 MG tablet Take 0.5 tablets by mouth daily.    . Multiple Vitamin (MULTIVITAMIN) capsule Take 1 capsule by mouth daily.     No current facility-administered medications on file prior to visit.   Past Medical History:  Diagnosis Date  . Allergy   . Cataract    beginning  . Hx of adenomatous polyp of colon 07/22/2016  . Obesity    Past Surgical History:  Procedure Laterality Date  . CESAREAN SECTION    . TUBAL LIGATION    . WISDOM TOOTH EXTRACTION      Allergies  Allergen Reactions  . Almond Meal Anaphylaxis    Almonds   . Elmer Bales  Oil     Other reaction(s): Other (See Comments)  . Penicillins     REACTION: hives    Family History  Problem Relation Age of Onset  . Hypertension Mother   . Diabetes Sister   . Diabetes Son   . Drug abuse Other   . Diabetes Other   . Colon cancer Neg Hx    . Colon polyps Neg Hx   . Esophageal cancer Neg Hx   . Rectal cancer Neg Hx   . Stomach cancer Neg Hx     Social History   Socioeconomic History  . Marital status: Divorced    Spouse name: Not on file  . Number of children: Not on file  . Years of education: Not on file  . Highest education level: Not on file  Occupational History  . Not on file  Tobacco Use  . Smoking status: Never Smoker  . Smokeless tobacco: Never Used  Substance and Sexual Activity  . Alcohol use: Yes    Comment: rare- very   . Drug use: No  . Sexual activity: Yes    Birth control/protection: Post-menopausal, Surgical  Other Topics Concern  . Not on file  Social History Narrative  . Not on file   Social Determinants of Health   Financial Resource Strain: Not on file  Food Insecurity: Not on file  Transportation Needs: Not on file  Physical Activity: Not on file  Stress: Not on file  Social Connections: Not on file   Vitals:   12/22/20 0725  BP: 128/70  Pulse: 73  Resp: 16  SpO2: 97%   Body mass index is 32.77 kg/m.  Wt Readings from Last 3 Encounters:  12/22/20 185 lb (83.9 kg)  09/08/19 198 lb 4 oz (89.9 kg)  08/24/18 208 lb (94.3 kg)   Physical Exam Vitals and nursing note reviewed.  Constitutional:      General: She is not in acute distress.    Appearance: She is well-developed.  HENT:     Head: Normocephalic and atraumatic.     Right Ear: Hearing, tympanic membrane, ear canal and external ear normal.     Left Ear: Hearing, tympanic membrane, ear canal and external ear normal.     Mouth/Throat:     Mouth: Mucous membranes are moist.     Pharynx: Oropharynx is clear. Uvula midline.  Eyes:     Extraocular Movements: Extraocular movements intact.     Conjunctiva/sclera: Conjunctivae normal.     Pupils: Pupils are equal, round, and reactive to light.  Neck:     Thyroid: No thyromegaly.     Trachea: No tracheal deviation.  Cardiovascular:     Rate and Rhythm: Normal rate  and regular rhythm.     Pulses:          Dorsalis pedis pulses are 2+ on the right side and 2+ on the left side.     Heart sounds: No murmur heard.   Pulmonary:     Effort: Pulmonary effort is normal. No respiratory distress.     Breath sounds: Normal breath sounds.  Chest:  Breasts:     Right: No axillary adenopathy or supraclavicular adenopathy.     Left: No axillary adenopathy or supraclavicular adenopathy.    Abdominal:     Palpations: Abdomen is soft. There is no hepatomegaly or mass.     Tenderness: There is no abdominal tenderness.  Genitourinary:    Comments: Breast: No masses,nipple discharge,or skin changes bilateral. Musculoskeletal:  Comments: No signs of synovitis appreciated.  Lymphadenopathy:     Cervical: No cervical adenopathy.     Upper Body:     Right upper body: No supraclavicular or axillary adenopathy.     Left upper body: No supraclavicular or axillary adenopathy.  Skin:    General: Skin is warm.     Findings: No erythema or rash.  Neurological:     General: No focal deficit present.     Mental Status: She is alert and oriented to person, place, and time.     Cranial Nerves: No cranial nerve deficit.     Coordination: Coordination normal.     Gait: Gait normal.     Deep Tendon Reflexes:     Reflex Scores:      Bicep reflexes are 2+ on the right side and 2+ on the left side.      Patellar reflexes are 2+ on the right side and 2+ on the left side. Psychiatric:     Comments: Well groomed, good eye contact.   ASSESSMENT AND PLAN:  Ms. CAMEKA RAE was here today annual physical examination.   Orders Placed This Encounter  Procedures  . DEXAScan  . Comprehensive metabolic panel  . Hemoglobin A1c  . Lipid panel  . Microalbumin / creatinine urine ratio  . Ambulatory referral to Gastroenterology   Lab Results  Component Value Date   ALT 10 12/22/2020   AST 15 12/22/2020   ALKPHOS 55 12/22/2020   BILITOT 0.4 12/22/2020    Lab  Results  Component Value Date   CREATININE 0.84 12/22/2020   BUN 12 12/22/2020   NA 141 12/22/2020   K 4.2 12/22/2020   CL 105 12/22/2020   CO2 29 12/22/2020   Lab Results  Component Value Date   CHOL 201 (H) 12/22/2020   HDL 57.10 12/22/2020   LDLCALC 134 (H) 12/22/2020   TRIG 51.0 12/22/2020   CHOLHDL 4 12/22/2020   Lab Results  Component Value Date   HGBA1C 6.1 12/22/2020   Lab Results  Component Value Date   MICROALBUR <0.7 12/22/2020   MICROALBUR <0.7 09/08/2019   Routine general medical examination at a health care facility She understands  the importance of regular physical activity and healthy diet for prevention of chronic illness and/or complications. Preventive guidelines reviewed. Vaccination up to date.  Ca++ and vit D supplementation recommended. Next CPE in a year.  Hyperlipidemia, unspecified hyperlipidemia type She is not on pharmacologic treatment. We discussed benefits of statin meds. Further recommendations according to FLP results.  Diabetes mellitus type II, non insulin dependent ([place]) HgA1C has been at goal. Continue non pharmacologic treatment. Annual eye exam, periodic dental and foot care recommended. F/U in 5-6 months  Asymptomatic postmenopausal estrogen deficiency -     DEXAScan; Future  Colon cancer screening -     Ambulatory referral to Gastroenterology   Return in 6 months (on 06/23/2021) for DM II.  Annice Jolly G. Martinique, MD  [facility]. [place] office.    Today you have you routine preventive visit. A few things to remember from today's visit:   Routine general medical examination at a health care facility  Hyperlipidemia, unspecified hyperlipidemia type - Plan: Lipid panel  Diabetes mellitus type II, non insulin dependent ([place]) - Plan: Comprehensive metabolic panel, Hemoglobin A1c, Microalbumin / creatinine urine ratio  Asymptomatic postmenopausal estrogen deficiency - Plan: DEXAScan  Colon cancer  screening - Plan: Ambulatory referral to Gastroenterology  Please be sure medication list is accurate.  If a new problem present, please set up appointment sooner than planned today.  At least 150 minutes of moderate exercise per week, daily brisk walking for 15-30 min is a good exercise option. Healthy diet low in saturated (animal) fats and sweets and consisting of fresh fruits and vegetables, lean meats such as fish and white chicken and whole grains.  These are some of recommendations for screening depending of age and risk factors:  - Vaccines:  Tdap vaccine every 10 years.  Shingles vaccine recommended at age 33, could be given after 67 years of age but not sure about insurance coverage.   Pneumonia vaccines: Pneumovax at 40. Sometimes Pneumovax is giving earlier if history of smoking, lung disease,diabetes,kidney disease among some. Completed.  Screening for diabetes at age 68 and every 3 years.N/A  Cervical cancer prevention:  Pap smear starts at 67 years of age and continues periodically until 67 years old in low risk women. Pap smear every 3 years between 4 and 77 years old. Pap smear every 3-5 years between women 25 and older if pap smear negative and HPV screening negative.   -Breast cancer: Mammogram: There is disagreement between experts about when to start screening in low risk asymptomatic female but recent recommendations are to start screening at 50 and not later than 67 years old , every 1-2 years and after 67 yo q 2 years. Screening is recommended until 68 years old but some women can continue screening depending of healthy issues.  Colon cancer screening: Has been recently changed to 67 yo. Insurance may not cover until you are 67 years old. Screening is recommended until 67 years old.  Cholesterol disorder screening at age 47 and every 3 years.  Also recommended:  1. Dental visit- Brush and floss your teeth twice daily; visit your dentist twice a year. 2. Eye  doctor- Get an eye exam at least every 2 years. 3. Helmet use- Always wear a helmet when riding a bicycle, motorcycle, rollerblading or skateboarding. 4. Safe sex- If you may be exposed to sexually transmitted infections, use a condom. 5. Seat belts- Seat belts can save your live; always wear one. 6. Smoke/Carbon Monoxide detectors- These detectors need to be installed on the appropriate level of your home. Replace batteries at least once a year. 7. Skin cancer- When out in the sun please cover up and use sunscreen 15 SPF or higher. 8. Violence- If anyone is threatening or hurting you, please tell your healthcare provider.  9. Drink alcohol in moderation- Limit alcohol intake to one drink or less per day. Never drink and drive. 10. Calcium supplementation 1000 to 1200 mg daily, ideally through your diet.  Vitamin D supplementation 800 units daily.

## 2021-01-01 ENCOUNTER — Other Ambulatory Visit: Payer: Self-pay

## 2021-01-01 ENCOUNTER — Ambulatory Visit (INDEPENDENT_AMBULATORY_CARE_PROVIDER_SITE_OTHER)
Admission: RE | Admit: 2021-01-01 | Discharge: 2021-01-01 | Disposition: A | Discharge status: Home / Self Care | Payer: Medicare PPO | Source: Ambulatory Visit | Attending: Family Medicine | Admitting: Family Medicine

## 2021-01-01 DIAGNOSIS — Z780 Asymptomatic menopausal state: Secondary | ICD-10-CM | POA: Diagnosis not present

## 2021-04-27 ENCOUNTER — Encounter: Payer: Self-pay | Admitting: Internal Medicine

## 2021-06-25 ENCOUNTER — Other Ambulatory Visit: Payer: Self-pay

## 2021-06-25 ENCOUNTER — Ambulatory Visit: Payer: Medicare PPO | Admitting: Family Medicine

## 2021-06-25 ENCOUNTER — Encounter: Payer: Self-pay | Admitting: Family Medicine

## 2021-06-25 VITALS — BP 118/70 | HR 66 | Resp 16 | Ht 63.0 in | Wt 166.0 lb

## 2021-06-25 DIAGNOSIS — E1169 Type 2 diabetes mellitus with other specified complication: Secondary | ICD-10-CM

## 2021-06-25 DIAGNOSIS — E785 Hyperlipidemia, unspecified: Secondary | ICD-10-CM

## 2021-06-25 DIAGNOSIS — E663 Overweight: Secondary | ICD-10-CM | POA: Diagnosis not present

## 2021-06-25 LAB — LIPID PANEL
Cholesterol: 178 mg/dL (ref 0–200)
HDL: 61.2 mg/dL (ref >39.00)
LDL Cholesterol: 109 mg/dL — ABNORMAL HIGH (ref 0–99)
NonHDL: 116.7
Total CHOL/HDL Ratio: 3
Triglycerides: 40 mg/dL (ref 0.0–149.0)
VLDL: 8 mg/dL (ref 0.0–40.0)

## 2021-06-25 LAB — HEMOGLOBIN A1C: Hgb A1c MFr Bld: 6 % (ref 4.6–6.5)

## 2021-06-25 NOTE — Progress Notes (Signed)
HPI: JaniceJanice Williamson is a 65 y.o. female, who is here today for 5-6 months follow up.  She was last seen on 12/22/20  Diabetes Mellitus II: Dx'ed in 08/2018 with HgA1C of 6.7. - Checking BG at home: No - Medications: On non pharmacologic treatment. - Diet: Yes, she stopped sweets and bread intake. Has lost wt since her last visit. - Exercise: She is walking a few times per week. - eye exam: 01/2021. - foot exam: Current.  - Negative for symptoms of hypoglycemia, polyuria, polydipsia, numbness extremities, foot ulcers/trauma  Lab Results  Component Value Date   HGBA1C 6.1 12/22/2020   Lab Results  Component Value Date   MICROALBUR <0.7 12/22/2020   Hyperlipidemia: Currently on non pharmacologic treatment. Following a low fat diet: Yes.. Side effects from medication:N/A  Negative for CP,SOB,palpitations,claudication,edema,or diaphoresis.  Lab Results  Component Value Date   CHOL 201 (H) 12/22/2020   HDL 57.10 12/22/2020   LDLCALC 134 (H) 12/22/2020   TRIG 51.0 12/22/2020   CHOLHDL 4 12/22/2020   Review of Systems  Constitutional:  Negative for activity change, appetite change and fever.  Respiratory:  Negative for cough and wheezing.   Gastrointestinal:  Negative for abdominal pain, nausea and vomiting.  Genitourinary:  Negative for decreased urine volume, dysuria and hematuria.  Musculoskeletal:  Negative for gait problem and myalgias.  Rest of ROS, see pertinent positives sand negatives in HPI  Current Outpatient Medications on File Prior to Visit  Medication Sig Dispense Refill   betamethasone, augmented, (DIPROLENE) 0.05 % lotion Apply to scalp 3 times per week. Not to face.     minoxidil (LONITEN) 2.5 MG tablet Take 0.5 tablets by mouth daily.     Multiple Vitamin (MULTIVITAMIN) capsule Take 1 capsule by mouth daily.     No current facility-administered medications on file prior to visit.   Past Medical History:  Diagnosis Date   Allergy    Cataract     beginning   Hx of adenomatous polyp of colon 07/22/2016   Obesity    Allergies  Allergen Reactions   Almond Meal Anaphylaxis    Almonds    Almond Oil     Other reaction(s): Other (See Comments)   Penicillins     REACTION: hives    Social History   Socioeconomic History   Marital status: Divorced    Spouse name: Not on file   Number of children: Not on file   Years of education: Not on file   Highest education level: Not on file  Occupational History   Not on file  Tobacco Use   Smoking status: Never   Smokeless tobacco: Never  Substance and Sexual Activity   Alcohol use: Yes    Comment: rare- very    Drug use: No   Sexual activity: Yes    Birth control/protection: Post-menopausal, Surgical  Other Topics Concern   Not on file  Social History Narrative   Not on file   Social Determinants of Health   Financial Resource Strain: Not on file  Food Insecurity: Not on file  Transportation Needs: Not on file  Physical Activity: Not on file  Stress: Not on file  Social Connections: Not on file   Vitals:   06/25/21 0805  BP: 118/70  Pulse: 66  Resp: 16  SpO2: 99%   Wt Readings from Last 3 Encounters:  06/25/21 166 lb (75.3 kg)  12/22/20 185 lb (83.9 kg)  09/08/19 198 lb 4 oz (89.9 kg)  Body mass index is 29.41 kg/m.  Physical Exam Vitals and nursing note reviewed.  Constitutional:      General: She is not in acute distress.    Appearance: She is well-developed.  HENT:     Head: Normocephalic and atraumatic.     Mouth/Throat:     Mouth: Mucous membranes are moist.     Pharynx: Oropharynx is clear.  Eyes:     Conjunctiva/sclera: Conjunctivae normal.  Cardiovascular:     Rate and Rhythm: Normal rate and regular rhythm.     Pulses:          Dorsalis pedis pulses are 2+ on the right side and 2+ on the left side.     Heart sounds: No murmur heard. Pulmonary:     Effort: Pulmonary effort is normal. No respiratory distress.     Breath sounds: Normal  breath sounds.  Abdominal:     Palpations: Abdomen is soft. There is no hepatomegaly or mass.     Tenderness: There is no abdominal tenderness.  Lymphadenopathy:     Cervical: No cervical adenopathy.  Skin:    General: Skin is warm.     Findings: No erythema or rash.  Neurological:     General: No focal deficit present.     Mental Status: She is alert and oriented to person, place, and time.     Cranial Nerves: No cranial nerve deficit.     Gait: Gait normal.  Psychiatric:     Comments: Well groomed, good eye contact.   ASSESSMENT AND PLAN:  Janice Williamson was seen today for 5-6 months follow-up.  Orders Placed This Encounter  Procedures   Hemoglobin A1c   Lipid panel   Lab Results  Component Value Date   HGBA1C 6.0 06/25/2021   Lab Results  Component Value Date   CHOL 178 06/25/2021   HDL 61.20 06/25/2021   LDLCALC 109 (H) 06/25/2021   TRIG 40.0 06/25/2021   CHOLHDL 3 06/25/2021   Type 2 diabetes mellitus with other specified complication (HCC) Co-morbility: HLD. HgA1C has been at goal. Continue non pharmacologic treatment. Annual eye exam, periodic dental and foot care to continue. She had her last eye exam in 01/2021 and today she has a dental appt.  F/U in 5-6 months  Hyperlipidemia She is not interested in pharmacologic treatment. We discussed CV benefits of statin medications given her hx  Further recommendations will be given according to FLP results.  Overweight (BMI 25.0-29.9) She has lost about 19 Lb since her last visit through changes in health style. BMI went from 32 to 29. We discussed benefits of wt loss. Consistency with healthy diet and physical activity encouraged.  Return in about 6 months (around 12/24/2021) for cpe.  Janice Williamson G. Martinique, MD  [facility]. [place] office.

## 2021-06-25 NOTE — Patient Instructions (Addendum)
A few things to remember from today's visit:  Hyperlipidemia, unspecified hyperlipidemia type  Diabetes mellitus type II, non insulin dependent ([place])  If you need refills please call your pharmacy. Do not use My Chart to request refills or for acute issues that need immediate attention.   No changes today. I will be making recommendations about cholesterol medication regardless of numbers because your history of diabetes. Continue the good work!!!  Please be sure medication list is accurate. If a new problem present, please set up appointment sooner than planned today.

## 2021-06-25 NOTE — Assessment & Plan Note (Addendum)
Co-morbility: HLD. HgA1C has been at goal. Continue non pharmacologic treatment. Annual eye exam, periodic dental and foot care to continue. She had her last eye exam in 01/2021 and today she has a dental appt.  F/U in 5-6 months

## 2021-06-25 NOTE — Assessment & Plan Note (Signed)
She has lost about 19 Lb since her last visit through changes in health style. BMI went from 32 to 29. We discussed benefits of wt loss. Consistency with healthy diet and physical activity encouraged.

## 2021-06-25 NOTE — Assessment & Plan Note (Signed)
She is not interested in pharmacologic treatment. We discussed CV benefits of statin medications given her hx  Further recommendations will be given according to FLP results.

## 2021-07-05 ENCOUNTER — Encounter: Payer: Self-pay | Admitting: Internal Medicine

## 2021-07-05 ENCOUNTER — Other Ambulatory Visit: Payer: Self-pay

## 2021-07-05 ENCOUNTER — Ambulatory Visit (AMBULATORY_SURGERY_CENTER): Payer: Medicare PPO | Admitting: *Deleted

## 2021-07-05 VITALS — Ht 64.0 in | Wt 161.0 lb

## 2021-07-05 DIAGNOSIS — Z86010 Personal history of colonic polyps: Secondary | ICD-10-CM

## 2021-07-05 NOTE — Progress Notes (Signed)
No egg or soy allergy known to patient  No issues known to pt with past sedation with any surgeries or procedures Patient denies ever being told they had issues or difficulty with intubation  No FH of Malignant Hyperthermia Pt is not on diet pills Pt is not on  home 02  Pt is not on blood thinners  Pt states  issues with constipation - uses Metamucil PRN- not smooth- pt states she has a BM daily but not always soft  No A fib or A flutter  Pt is fully vaccinated  for Covid  Due to the COVID-19 pandemic we are asking patients to follow certain guidelines in PV and the [place]   Pt aware of COVID protocols and [facility] guidelines   Pt verified name, DOB, address and insurance during PV today.  Pt mailed instruction packet of Emmi video, copy of consent form to read and not return, and instructions.  PV completed over the phone.  Pt encouraged to call with questions or issues.  My Chart instructions to pt as well

## 2021-07-19 ENCOUNTER — Other Ambulatory Visit: Payer: Self-pay

## 2021-07-19 ENCOUNTER — Encounter: Payer: Self-pay | Admitting: Internal Medicine

## 2021-07-19 ENCOUNTER — Ambulatory Visit (AMBULATORY_SURGERY_CENTER): Payer: Medicare PPO | Admitting: Internal Medicine

## 2021-07-19 VITALS — BP 124/70 | HR 64 | Temp 98.0°F | Resp 16

## 2021-07-19 DIAGNOSIS — Z86010 Personal history of colonic polyps: Secondary | ICD-10-CM

## 2021-07-19 DIAGNOSIS — D123 Benign neoplasm of transverse colon: Secondary | ICD-10-CM

## 2021-07-19 MED ORDER — SODIUM CHLORIDE 0.9 % IV SOLN: 500.0000 mL | Freq: Once | INTRAVENOUS | Status: DC

## 2021-07-19 NOTE — Progress Notes (Signed)
[facility] History and Physical   Primary Care Physician:  Martinique, Betty G, MD   Reason for Procedure:   Hx colon polyps  Plan:    colonoscopy     HPI: Janice Williamson is a 65 y.o. female s/p colonoscopy and polypectomy 10/17 - 6 mm adenoma. Here for surveillance colonoscopy.   Past Medical History:  Diagnosis Date   Allergy    Cataract    removed both - lens implants   Hx of adenomatous polyp of colon 07/22/2016   Obesity    Prediabetes    no meds- A1C 6.0    Past Surgical History:  Procedure Laterality Date   CATARACT EXTRACTION W/ INTRAOCULAR LENS IMPLANT     CESAREAN SECTION     COLONOSCOPY     KELOID EXCISION Left    age 69   POLYPECTOMY     TUBAL LIGATION     WISDOM TOOTH EXTRACTION      Prior to Admission medications   Medication Sig Start Date End Date Taking? Authorizing Provider  psyllium (METAMUCIL) 58.6 % powder Take 1 packet by mouth as needed.   Yes [provider]  betamethasone, augmented, (DIPROLENE) 0.05 % lotion Apply to scalp 3 times per week. Not to face. 07/30/18   [provider]  minoxidil (LONITEN) 2.5 MG tablet Take 0.5 tablets by mouth daily. 05/11/20   [provider]  Multiple Vitamin (MULTIVITAMIN) capsule Take 1 capsule by mouth daily.    [provider]  vitamin E 180 MG (400 UNITS) capsule Take 400 Units by mouth daily. Patient not taking: Reported on 07/19/2021    [provider]    Current Outpatient Medications  Medication Sig Dispense Refill   psyllium (METAMUCIL) 58.6 % powder Take 1 packet by mouth as needed.     betamethasone, augmented, (DIPROLENE) 0.05 % lotion Apply to scalp 3 times per week. Not to face.     minoxidil (LONITEN) 2.5 MG tablet Take 0.5 tablets by mouth daily.     Multiple Vitamin (MULTIVITAMIN) capsule Take 1 capsule by mouth daily.     vitamin E 180 MG (400 UNITS) capsule Take 400 Units by mouth daily. (Patient not taking: Reported on  07/19/2021)     Current Facility-Administered Medications  Medication Dose Route Frequency Provider Last Rate Last Admin   0.9 %  sodium chloride infusion  500 mL Intravenous Once Gatha Mayer, MD        Allergies as of 07/19/2021 - Review Complete 07/19/2021  Allergen Reaction Noted   Almond meal Anaphylaxis 11/08/2012   Almond oil  06/02/2012   Penicillins  01/13/2009    Family History  Problem Relation Age of Onset   Hypertension Mother    Diabetes Sister    Diabetes Son    Drug abuse Other    Diabetes Other    Colon cancer Neg Hx    Colon polyps Neg Hx    Esophageal cancer Neg Hx    Rectal cancer Neg Hx    Stomach cancer Neg Hx     Social History   Socioeconomic History   Marital status: Divorced    Spouse name: Not on file   Number of children: Not on file   Years of education: Not on file   Highest education level: Not on file  Occupational History   Not on file  Tobacco Use   Smoking status: Never   Smokeless tobacco: Never  Substance and Sexual Activity   Alcohol use: Yes  Comment: rare- very    Drug use: No   Sexual activity: Yes    Birth control/protection: Post-menopausal, Surgical  Other Topics Concern   Not on file  Social History Narrative   Not on file   Social Determinants of Health   Financial Resource Strain: Not on file  Food Insecurity: Not on file  Transportation Needs: Not on file  Physical Activity: Not on file  Stress: Not on file  Social Connections: Not on file  Intimate Partner Violence: Not on file    Review of Systems:  All other review of systems negative except as mentioned in the HPI.  Physical Exam: Vital signs Temp 98 F (36.7 C)   General:   Alert,  Well-developed, well-nourished, pleasant and cooperative in NAD Lungs:  Clear throughout to auscultation.   Heart:  Regular rate and rhythm; no murmurs, clicks, rubs,  or gallops. Abdomen:  Soft, nontender and nondistended. Normal bowel sounds.    Neuro/Psych:  Alert and cooperative. Normal mood and affect. A and O x 3   @Juelz Whittenberg  Simonne Maffucci, MD, [facility] [phone] (pager) 07/19/2021 9:22 AM@

## 2021-07-19 NOTE — Patient Instructions (Addendum)
I found and removed one tiny polyp today.  I will let you know pathology results and when to have another routine colonoscopy by mail and/or My Chart.  I also saw some hemorrhoids - swollen from the prep. Should not be a problem.  I appreciate the opportunity to care for you. Gatha Mayer, MD, FACG    YOU HAD AN ENDOSCOPIC PROCEDURE TODAY AT [facility]:   Refer to the procedure report that was given to you for any specific questions about what was found during the examination.  If the procedure report does not answer your questions, please call your gastroenterologist to clarify.  If you requested that your care partner not be given the details of your procedure findings, then the procedure report has been included in a sealed envelope for you to review at your convenience later.  YOU SHOULD EXPECT: Some feelings of bloating in the abdomen. Passage of more gas than usual.  Walking can help get rid of the air that was put into your GI tract during the procedure and reduce the bloating. If you had a lower endoscopy (such as a colonoscopy or flexible sigmoidoscopy) you may notice spotting of blood in your stool or on the toilet paper. If you underwent a bowel prep for your procedure, you may not have a normal bowel movement for a few days.  Please Note:  You might notice some irritation and congestion in your nose or some drainage.  This is from the oxygen used during your procedure.  There is no need for concern and it should clear up in a day or so.  SYMPTOMS TO REPORT IMMEDIATELY:  Following lower endoscopy (colonoscopy or flexible sigmoidoscopy):  Excessive amounts of blood in the stool  Significant tenderness or worsening of abdominal pains  Swelling of the abdomen that is new, acute  Fever of 100F or higher   For urgent or emergent issues, a gastroenterologist can be reached at any hour by calling [phone]. Do not use MyChart messaging for urgent concerns.     DIET:  We do recommend a small meal at first, but then you may proceed to your regular diet.  Drink plenty of fluids but you should avoid alcoholic beverages for 24 hours.  ACTIVITY:  You should plan to take it easy for the rest of today and you should NOT DRIVE or use heavy machinery until tomorrow (because of the sedation medicines used during the test).    FOLLOW UP: Our staff will call the number listed on your records 48-72 hours following your procedure to check on you and address any questions or concerns that you may have regarding the information given to you following your procedure. If we do not reach you, we will leave a message.  We will attempt to reach you two times.  During this call, we will ask if you have developed any symptoms of COVID 19. If you develop any symptoms (ie: fever, flu-like symptoms, shortness of breath, cough etc.) before then, please call [phone].  If you test positive for Covid 19 in the 2 weeks post procedure, please call and report this information to [place].    If any biopsies were taken you will be contacted by phone or by letter within the next 1-3 weeks.  Please call us at [phone] if you have not heard about the biopsies in 3 weeks.    SIGNATURES/CONFIDENTIALITY: You and/or your care partner have signed paperwork which will be entered into your  electronic medical record.  These signatures attest to the fact that that the information above on your After Visit Summary has been reviewed and is understood.  Full responsibility of the confidentiality of this discharge information lies with you and/or your care-partner.

## 2021-07-19 NOTE — Progress Notes (Signed)
Vss nad pt transferred

## 2021-07-19 NOTE — Op Note (Addendum)
[place] Patient Name: Janice Williamson Procedure Date: 07/19/2021 9:17 AM MRN: [id number] Endoscopist: Gatha Mayer , MD Age: 65 Referring MD:  Date of Birth: 08-Oct-1955 Gender: Female Account #: [id number] Procedure:                Colonoscopy Medicines:                Propofol per Anesthesia, Monitored Anesthesia Care Procedure:                Pre-Anesthesia Assessment:                           - Prior to the procedure, a History and Physical                            was performed, and patient medications and                            allergies were reviewed. The patient's tolerance of                            previous anesthesia was also reviewed. The risks                            and benefits of the procedure and the sedation                            options and risks were discussed with the patient.                            All questions were answered, and informed consent                            was obtained. Prior Anticoagulants: The patient has                            taken no previous anticoagulant or antiplatelet                            agents. ASA Grade Assessment: III - A patient with                            severe systemic disease. After reviewing the risks                            and benefits, the patient was deemed in                            satisfactory condition to undergo the procedure.                           After obtaining informed consent, the colonoscope                            was passed under direct vision. Throughout  the                            procedure, the patient's blood pressure, pulse, and                            oxygen saturations were monitored continuously. The                            Olympus PCF-H190DL ([id number]) Colonoscope was                            introduced through the anus and advanced to the the                            cecum, identified by appendiceal orifice and                             ileocecal valve. The colonoscopy was somewhat                            difficult due to significant looping. Successful                            completion of the procedure was aided by applying                            abdominal pressure. The patient tolerated the                            procedure well. The quality of the bowel                            preparation was good. The ileocecal valve,                            appendiceal orifice, and rectum were photographed.                            The bowel preparation used was Miralax via split                            dose instruction. Scope In: 9:34:01 AM Scope Out: 9:58:12 AM Scope Withdrawal Time: 0 hours 16 minutes 59 seconds  Total Procedure Duration: 0 hours 24 minutes 11 seconds  Findings:                 The perianal and digital rectal examinations were                            normal.                           A diminutive polyp was found in the transverse  colon. The polyp was sessile. The polyp was removed                            with a cold snare. Resection and retrieval were                            complete. Verification of patient identification                            for the specimen was done. Estimated blood loss was                            minimal.                           Internal hemorrhoids were found.                           The exam was otherwise without abnormality on                            direct and retroflexion views. Complications:            No immediate complications. Estimated Blood Loss:     Estimated blood loss was minimal. Impression:               - One diminutive polyp in the transverse colon,                            removed with a cold snare. Resected and retrieved.                           - Internal hemorrhoids.                           - The examination was otherwise normal on direct                            and  retroflexion views.                           - Personal history of colonic polypm adenoma 2017. Recommendation:           - Patient has a contact number available for                            emergencies. The signs and symptoms of potential                            delayed complications were discussed with the                            patient. Return to normal activities tomorrow.                            Written discharge instructions were provided to the  patient.                           - Resume previous diet.                           - Continue present medications.                           - Repeat colonoscopy is recommended. The                            colonoscopy date will be determined after pathology                            results from today's exam become available for                            review. Gatha Mayer, MD 07/19/2021 10:04:06 AM This report has been signed electronically.

## 2021-07-19 NOTE — Progress Notes (Signed)
Called to room to assist during endoscopic procedure.  Patient ID and intended procedure confirmed with present staff. Received instructions for my participation in the procedure from the performing physician.  

## 2021-07-24 ENCOUNTER — Telehealth: Payer: Self-pay

## 2021-07-24 NOTE — Telephone Encounter (Signed)
  Follow up Call-  Call back number 07/19/2021  Post procedure Call Back phone  # [phone]  Permission to leave phone message Yes  Some recent data might be hidden     Patient questions:  Do you have a fever, pain , or abdominal swelling? No. Pain Score  0 *  Have you tolerated food without any problems? Yes.    Have you been able to return to your normal activities? Yes.    Do you have any questions about your discharge instructions: Diet   No. Medications  No. Follow up visit  No.  Do you have questions or concerns about your Care? No.  Actions: * If pain score is 4 or above: No action needed, pain <4.

## 2021-07-26 DIAGNOSIS — L669 Cicatricial alopecia, unspecified: Secondary | ICD-10-CM | POA: Diagnosis not present

## 2021-07-26 DIAGNOSIS — L658 Other specified nonscarring hair loss: Secondary | ICD-10-CM | POA: Diagnosis not present

## 2021-07-30 ENCOUNTER — Encounter: Payer: Self-pay | Admitting: Internal Medicine

## 2021-07-30 DIAGNOSIS — Z86010 Personal history of colonic polyps: Secondary | ICD-10-CM

## 2021-08-13 DIAGNOSIS — Z1231 Encounter for screening mammogram for malignant neoplasm of breast: Secondary | ICD-10-CM | POA: Diagnosis not present

## 2021-08-28 DIAGNOSIS — R922 Inconclusive mammogram: Secondary | ICD-10-CM | POA: Diagnosis not present

## 2021-08-28 DIAGNOSIS — R928 Other abnormal and inconclusive findings on diagnostic imaging of breast: Secondary | ICD-10-CM | POA: Diagnosis not present

## 2021-08-28 LAB — HM MAMMOGRAPHY

## 2021-08-29 ENCOUNTER — Encounter: Payer: Self-pay | Admitting: Family Medicine

## 2021-09-21 ENCOUNTER — Ambulatory Visit (INDEPENDENT_AMBULATORY_CARE_PROVIDER_SITE_OTHER): Payer: Medicare PPO

## 2021-09-21 VITALS — Ht 63.0 in | Wt 166.0 lb

## 2021-09-21 DIAGNOSIS — Z0000 Encounter for general adult medical examination without abnormal findings: Secondary | ICD-10-CM

## 2021-09-21 NOTE — Progress Notes (Signed)
Subjective:   Janice Williamson is a 65 y.o. female who presents for Medicare Annual (Subsequent) preventive examination.  Review of Systems    No ROS Cardiac Risk Factors include: advanced age (>31men, >18 women)    Objective:    Today's Vitals   09/21/21 0857  Weight: 166 lb (75.3 kg)  Height: 5\' 3"  (1.6 m)   Body mass index is 29.41 kg/m.  Advanced Directives 09/21/2021 02/26/2017 07/09/2016 06/25/2016  Does Patient Have a Medical Advance Directive? No No No No  Would patient like information on creating a medical advance directive? No - Patient declined - - -    Current Medications (verified) Outpatient Encounter Medications as of 09/21/2021  Medication Sig   betamethasone, augmented, (DIPROLENE) 0.05 % lotion Apply to scalp 3 times per week. Not to face.   minoxidil (LONITEN) 2.5 MG tablet Take 0.5 tablets by mouth daily.   Multiple Vitamin (MULTIVITAMIN) capsule Take 1 capsule by mouth daily.   psyllium (METAMUCIL) 58.6 % powder Take 1 packet by mouth as needed.   vitamin E 180 MG (400 UNITS) capsule Take 400 Units by mouth daily. (Patient not taking: Reported on 07/19/2021)   No facility-administered encounter medications on file as of 09/21/2021.    Allergies (verified) Almond meal, Almond oil, and Penicillins   History: Past Medical History:  Diagnosis Date   Allergy    Cataract    removed both - lens implants   Hx of adenomatous polyp of colon 07/22/2016   Obesity    Prediabetes    no meds- A1C 6.0   Past Surgical History:  Procedure Laterality Date   CATARACT EXTRACTION W/ INTRAOCULAR LENS IMPLANT     CESAREAN SECTION     COLONOSCOPY     KELOID EXCISION Left    age 43   POLYPECTOMY     TUBAL LIGATION     WISDOM TOOTH EXTRACTION     Family History  Problem Relation Age of Onset   Hypertension Mother    Diabetes Sister    Diabetes Son    Drug abuse Other    Diabetes Other    Colon cancer Neg Hx    Colon polyps Neg Hx    Esophageal cancer  Neg Hx    Rectal cancer Neg Hx    Stomach cancer Neg Hx    Social History   Socioeconomic History   Marital status: Divorced    Spouse name: Not on file   Number of children: Not on file   Years of education: Not on file   Highest education level: Not on file  Occupational History   Not on file  Tobacco Use   Smoking status: Never   Smokeless tobacco: Never  Substance and Sexual Activity   Alcohol use: Yes    Comment: rare- very    Drug use: No   Sexual activity: Yes    Birth control/protection: Post-menopausal, Surgical  Other Topics Concern   Not on file  Social History Narrative   Not on file   Social Determinants of Health   Financial Resource Strain: Low Risk    Difficulty of Paying Living Expenses: Not hard at all  Food Insecurity: No Food Insecurity   Worried About Charity fundraiser in the Last Year: Never true   Ran Out of Food in the Last Year: Never true  Transportation Needs: No Transportation Needs   Lack of Transportation (Medical): No   Lack of Transportation (Non-Medical): No  Physical Activity: Sufficiently Active  Days of Exercise per Week: 7 days   Minutes of Exercise per Session: 60 min  Stress: No Stress Concern Present   Feeling of Stress : Not at all  Social Connections: Moderately Integrated   Frequency of Communication with Friends and Family: More than three times a week   Frequency of Social Gatherings with Friends and Family: More than three times a week   Attends Religious Services: More than 4 times per year   Active Member of [employer] or Organizations: Yes   Attends Archivist Meetings: More than 4 times per year   Marital Status: Divorced     Clinical Intake: How often do you need to have someone help you when you read instructions, pamphlets, or other written materials from your doctor or pharmacy?: 1 - Never  Diabetic? No   Activities of Daily Living In your present state of health, do you have any difficulty  performing the following activities: 09/21/2021 12/22/2020  Hearing? N N  Vision? N N  Difficulty concentrating or making decisions? N N  Walking or climbing stairs? N N  Dressing or bathing? N N  Doing errands, shopping? N N  Preparing Food and eating ? N -  Using the Toilet? N -  In the past six months, have you accidently leaked urine? N -  Do you have problems with loss of bowel control? N -  Managing your Medications? N -  Managing your Finances? N -  Housekeeping or managing your Housekeeping? N -  Some recent data might be hidden    Patient Care Team: Martinique, Betty G, MD as PCP - General (Family Medicine)  Indicate any recent Medical Services you may have received from other than [facility] providers in the past year (date may be approximate).     Assessment:   This is a routine wellness examination for [place].   Virtual Visit via Telephone Note  I connected with  [name] on 09/21/21 at  9:00 AM EST by telephone and verified that I am speaking with the correct person using two identifiers.  Location: Patient: Home Provider: Office Persons participating in the virtual visit: patient/Nurse Health Advisor   I discussed the limitations, risks, security and privacy concerns of performing an evaluation and management service by telephone and the availability of in person appointments. The patient expressed understanding and agreed to proceed.  Interactive audio and video telecommunications were attempted between this nurse and patient, however failed, due to patient having technical difficulties OR patient did not have access to video capability.  We continued and completed visit with audio only.  Some vital signs may be absent or patient reported.   Criselda Peaches, LPN  Hearing/Vision screen Hearing Screening - Comments:: No difficulty haering Vision Screening - Comments:: Wears glasses. Followed by [facility]  Dietary issues and exercise activities  discussed: Current Exercise Habits: Home exercise routine, Type of exercise: walking, Time (Minutes): 60, Frequency (Times/Week): 7, Weekly Exercise (Minutes/Week): 420, Intensity: Moderate   Goals Addressed             This Visit's Progress    Weight (lb) < 200 lb (90.7 kg)   166 lb (75.3 kg)    Patient states want to maintain good eating habits.       Depression Screen PHQ 2/9 Scores 09/21/2021 12/22/2020 09/08/2019 07/21/2018  PHQ - 2 Score 0 0 0 0    Fall Risk Fall Risk  09/21/2021 12/22/2020  Falls in the past year? 0  0  Number falls in past yr: 0 -  Injury with Fall? 0 -    FALL RISK PREVENTION PERTAINING TO THE HOME:  Any stairs in or around the home? Yes  If so, are there any without handrails? No  Home free of loose throw rugs in walkways, pet beds, electrical cords, etc? Yes  Adequate lighting in your home to reduce risk of falls? Yes   ASSISTIVE DEVICES UTILIZED TO PREVENT FALLS:  Life alert? No  Use of a cane, walker or w/c? No  Grab bars in the bathroom? Yes  Shower chair or bench in shower? No  Elevated toilet seat or a handicapped toilet? No   TIMED UP AND GO:  Was the test performed? No . Audio Visit  Cognitive Function:     Immunizations Immunization History  Administered Date(s) Administered   Fluad Quad(high Dose 65+) 06/10/2019   Influenza Split 07/29/2012   Influenza, Seasonal, Injecte, Preservative Fre 06/25/2018   Influenza,inj,Quad PF,6+ Mos 07/19/2016, 06/25/2018   Influenza-Unspecified 07/15/2014, 06/10/2017   MODERNA COVID-19 SARS-COV-2 PEDS BIVALENT BOOSTER 6Y-11Y 07/13/2021   Pneumococcal Conjugate-13 12/21/2020   Pneumococcal Polysaccharide-23 09/08/2019, 12/21/2020   Tdap 07/29/2012   Zoster Recombinat (Shingrix) 06/10/2019, 09/08/2019    Covid-19 vaccine status: Completed vaccines   Screening Tests Health Maintenance  Topic Date Due   COVID-19 Vaccine (1) 07/13/2021   INFLUENZA VACCINE  12/21/2021 (Originally 04/23/2021)    OPHTHALMOLOGY EXAM  12/08/2021   FOOT EXAM  12/22/2021   HEMOGLOBIN A1C  12/24/2021   TETANUS/TDAP  07/29/2022   MAMMOGRAM  08/29/2023   COLONOSCOPY (Pts 45-37yrs Insurance coverage will need to be confirmed)  07/19/2028   Pneumonia Vaccine 18+ Years old  Completed   DEXA SCAN  Completed   Hepatitis C Screening  Completed   Zoster Vaccines- Shingrix  Completed   HPV VACCINES  Aged Out    Health Maintenance  Health Maintenance Due  Topic Date Due   COVID-19 Vaccine (1) 07/13/2021    Additional Screening:  Vision Screening: Recommended annual ophthalmology exams for early detection of glaucoma and other disorders of the eye. Is the patient up to date with their annual eye exam?  Yes  Who is the provider or what is the name of the office in which the patient attends annual eye exams? Followed by [facility]  Dental Screening: Recommended annual dental exams for proper oral hygiene  Community Resource Referral / Chronic Care Management:  CRR required this visit?  No   CCM required this visit?  No     Plan:    I have personally reviewed and noted the following in the patients chart:   Medical and social history Use of alcohol, tobacco or illicit drugs  Current medications and supplements including opioid prescriptions. Patient currently not taking opioids Functional ability and status Nutritional status Physical activity Advanced directives List of other physicians Hospitalizations, surgeries, and ER visits in previous 12 months Vitals Screenings to include cognitive, depression, and falls Referrals and appointments  In addition, I have reviewed and discussed with patient certain preventive protocols, quality metrics, and best practice recommendations. A written personalized care plan for preventive services as well as general preventive health recommendations were provided to patient.     Criselda Peaches, LPN   [id number]

## 2021-09-21 NOTE — Patient Instructions (Addendum)
[name] , Thank you for taking time to come for your Medicare Wellness Visit. I appreciate your ongoing commitment to your health goals. Please review the following plan we discussed and let me know if I can assist you in the future.   These are the goals we discussed:  Goals      Weight (lb) < 200 lb (90.7 kg)     Patient states want to maintain good eating habits.        This is a list of the screening recommended for you and due dates:  Health Maintenance  Topic Date Due   COVID-19 Vaccine (1) 07/13/2021   Flu Shot  12/21/2021*   Eye exam for diabetics  12/08/2021   Complete foot exam   12/22/2021   Hemoglobin A1C  12/24/2021   Tetanus Vaccine  07/29/2022   Mammogram  08/29/2023   Colon Cancer Screening  07/19/2028   Pneumonia Vaccine  Completed   DEXA scan (bone density measurement)  Completed   Hepatitis C Screening: USPSTF Recommendation to screen - Ages 43-79 yo.  Completed   Zoster (Shingles) Vaccine  Completed   HPV Vaccine  Aged Out  *Topic was postponed. The date shown is not the original due date.    Advanced directives: No  Conditions/risks identified: None  Next appointment: Follow up in one year for your annual wellness visit   Preventive Care 67 Years and Older, Female Preventive care refers to lifestyle choices and visits with your health care provider that can promote health and wellness. What does preventive care include? A yearly physical exam. This is also called an annual well check. Dental exams once or twice a year. Routine eye exams. Ask your health care provider how often you should have your eyes checked. Personal lifestyle choices, including: Daily care of your teeth and gums. Regular physical activity. Eating a healthy diet. Avoiding tobacco and drug use. Limiting alcohol use. Practicing safe sex. Taking low-dose aspirin every day. Taking vitamin and mineral supplements as recommended by your health care provider. What happens  during an annual well check? The services and screenings done by your health care provider during your annual well check will depend on your age, overall health, lifestyle risk factors, and family history of disease. Counseling  Your health care provider may ask you questions about your: Alcohol use. Tobacco use. Drug use. Emotional well-being. Home and relationship well-being. Sexual activity. Eating habits. History of falls. Memory and ability to understand (cognition). Work and work Statistician. Reproductive health. Screening  You may have the following tests or measurements: Height, weight, and BMI. Blood pressure. Lipid and cholesterol levels. These may be checked every 5 years, or more frequently if you are over 84 years old. Skin check. Lung cancer screening. You may have this screening every year starting at age 19 if you have a 30-pack-year history of smoking and currently smoke or have quit within the past 15 years. Fecal occult blood test (FOBT) of the stool. You may have this test every year starting at age 31. Flexible sigmoidoscopy or colonoscopy. You may have a sigmoidoscopy every 5 years or a colonoscopy every 10 years starting at age 75. Hepatitis C blood test. Hepatitis B blood test. Sexually transmitted disease (STD) testing. Diabetes screening. This is done by checking your blood sugar (glucose) after you have not eaten for a while (fasting). You may have this done every 1-3 years. Bone density scan. This is done to screen for osteoporosis. You may have this done  starting at age 67. Mammogram. This may be done every 1-2 years. Talk to your health care provider about how often you should have regular mammograms. Talk with your health care provider about your test results, treatment options, and if necessary, the need for more tests. Vaccines  Your health care provider may recommend certain vaccines, such as: Influenza vaccine. This is recommended every  year. Tetanus, diphtheria, and acellular pertussis (Tdap, Td) vaccine. You may need a Td booster every 10 years. Zoster vaccine. You may need this after age 2. Pneumococcal 13-valent conjugate (PCV13) vaccine. One dose is recommended after age 50. Pneumococcal polysaccharide (PPSV23) vaccine. One dose is recommended after age 21. Talk to your health care provider about which screenings and vaccines you need and how often you need them. This information is not intended to replace advice given to you by your health care provider. Make sure you discuss any questions you have with your health care provider. Document Released: 10/06/2015 Document Revised: 05/29/2016 Document Reviewed: 07/11/2015 Elsevier Interactive Patient Education  2017 [employer] Prevention in the Home Falls can cause injuries. They can happen to people of all ages. There are many things you can do to make your home safe and to help prevent falls. What can I do on the outside of my home? Regularly fix the edges of walkways and driveways and fix any cracks. Remove anything that might make you trip as you walk through a door, such as a raised step or threshold. Trim any bushes or trees on the path to your home. Use bright outdoor lighting. Clear any walking paths of anything that might make someone trip, such as rocks or tools. Regularly check to see if handrails are loose or broken. Make sure that both sides of any steps have handrails. Any raised decks and porches should have guardrails on the edges. Have any leaves, snow, or ice cleared regularly. Use sand or salt on walking paths during winter. Clean up any spills in your garage right away. This includes oil or grease spills. What can I do in the bathroom? Use night lights. Install grab bars by the toilet and in the tub and shower. Do not use towel bars as grab bars. Use non-skid mats or decals in the tub or shower. If you need to sit down in the shower, use a  plastic, non-slip stool. Keep the floor dry. Clean up any water that spills on the floor as soon as it happens. Remove soap buildup in the tub or shower regularly. Attach bath mats securely with double-sided non-slip rug tape. Do not have throw rugs and other things on the floor that can make you trip. What can I do in the bedroom? Use night lights. Make sure that you have a light by your bed that is easy to reach. Do not use any sheets or blankets that are too big for your bed. They should not hang down onto the floor. Have a firm chair that has side arms. You can use this for support while you get dressed. Do not have throw rugs and other things on the floor that can make you trip. What can I do in the kitchen? Clean up any spills right away. Avoid walking on wet floors. Keep items that you use a lot in easy-to-reach places. If you need to reach something above you, use a strong step stool that has a grab bar. Keep electrical cords out of the way. Do not use floor polish or wax that  makes floors slippery. If you must use wax, use non-skid floor wax. Do not have throw rugs and other things on the floor that can make you trip. What can I do with my stairs? Do not leave any items on the stairs. Make sure that there are handrails on both sides of the stairs and use them. Fix handrails that are broken or loose. Make sure that handrails are as long as the stairways. Check any carpeting to make sure that it is firmly attached to the stairs. Fix any carpet that is loose or worn. Avoid having throw rugs at the top or bottom of the stairs. If you do have throw rugs, attach them to the floor with carpet tape. Make sure that you have a light switch at the top of the stairs and the bottom of the stairs. If you do not have them, ask someone to add them for you. What else can I do to help prevent falls? Wear shoes that: Do not have high heels. Have rubber bottoms. Are comfortable and fit you  well. Are closed at the toe. Do not wear sandals. If you use a stepladder: Make sure that it is fully opened. Do not climb a closed stepladder. Make sure that both sides of the stepladder are locked into place. Ask someone to hold it for you, if possible. Clearly mark and make sure that you can see: Any grab bars or handrails. First and last steps. Where the edge of each step is. Use tools that help you move around (mobility aids) if they are needed. These include: Canes. Walkers. Scooters. Crutches. Turn on the lights when you go into a dark area. Replace any light bulbs as soon as they burn out. Set up your furniture so you have a clear path. Avoid moving your furniture around. If any of your floors are uneven, fix them. If there are any pets around you, be aware of where they are. Review your medicines with your doctor. Some medicines can make you feel dizzy. This can increase your chance of falling. Ask your doctor what other things that you can do to help prevent falls. This information is not intended to replace advice given to you by your health care provider. Make sure you discuss any questions you have with your health care provider. Document Released: 07/06/2009 Document Revised: 02/15/2016 Document Reviewed: 10/14/2014 Elsevier Interactive Patient Education  2017 [employer].

## 2021-10-22 NOTE — Progress Notes (Signed)
ACUTE VISIT Chief Complaint  Patient presents with   cold finger tips    X 3 weeks   HPI: [name] is a 66 y.o. female with history of hyperlipidemia and prediabetes with above complaint. There is no associated pain,skin rash,or ulcers. It takes a few minutes for finger to warm up. Today she noted puplish changes on one finger that lasted a few minutes but has seen pale finger. No associated arthralgias,joint edema or erythema. Negative for numbness or tingling. Alleviated by massaging and moving fingers.  It seems more frequent during cold days but has happened during warm days as well and when having gloves on.  DM II: Dx'ed in 08/2018. She is on non pharmacologic treatment. Last eye exam:11/2020. Last foot exam: 12/2020. Negative for abdominal pain, nausea,vomiting, polydipsia,polyuria, or polyphagia.  Lab Results  Component Value Date   HGBA1C 6.0 06/25/2021   Review of Systems  Constitutional:  Negative for activity change, appetite change, chills, fatigue and fever.  HENT:  Negative for mouth sores and sore throat.   Respiratory:  Negative for cough, shortness of breath and wheezing.   Cardiovascular:  Negative for chest pain, palpitations and leg swelling.  Genitourinary:  Negative for decreased urine volume and hematuria.  Neurological:  Negative for syncope, weakness and headaches.  Rest see pertinent positives and negatives per HPI.  Current Outpatient Medications on File Prior to Visit  Medication Sig Dispense Refill   betamethasone, augmented, (DIPROLENE) 0.05 % lotion Apply to scalp 3 times per week. Not to face.     minoxidil (LONITEN) 2.5 MG tablet Take 0.5 tablets by mouth daily.     Multiple Vitamin (MULTIVITAMIN) capsule Take 1 capsule by mouth daily.     psyllium (METAMUCIL) 58.6 % powder Take 1 packet by mouth as needed.     No current facility-administered medications on file prior to visit.   Past Medical History:  Diagnosis Date    Allergy    Cataract    removed both - lens implants   Hx of adenomatous polyp of colon 07/22/2016   Obesity    Prediabetes    no meds- A1C 6.0   Allergies  Allergen Reactions   Almond Meal Anaphylaxis    Almonds    Almond Oil     Other reaction(s): Other (See Comments)   Penicillins     REACTION: hives    Social History   Socioeconomic History   Marital status: Divorced    Spouse name: Not on file   Number of children: Not on file   Years of education: Not on file   Highest education level: Not on file  Occupational History   Not on file  Tobacco Use   Smoking status: Never   Smokeless tobacco: Never  Substance and Sexual Activity   Alcohol use: Yes    Comment: rare- very    Drug use: No   Sexual activity: Yes    Birth control/protection: Post-menopausal, Surgical  Other Topics Concern   Not on file  Social History Narrative   Not on file   Social Determinants of Health   Financial Resource Strain: Low Risk    Difficulty of Paying Living Expenses: Not hard at all  Food Insecurity: No Food Insecurity   Worried About Charity fundraiser in the Last Year: Never true   [place] in the Last Year: Never true  Transportation Needs: No Transportation Needs   Lack of Transportation (Medical): No   Lack of Transportation (  Non-Medical): No  Physical Activity: Sufficiently Active   Days of Exercise per Week: 7 days   Minutes of Exercise per Session: 60 min  Stress: No Stress Concern Present   Feeling of Stress : Not at all  Social Connections: Moderately Integrated   Frequency of Communication with Friends and Family: More than three times a week   Frequency of Social Gatherings with Friends and Family: More than three times a week   Attends Religious Services: More than 4 times per year   Active Member of Clubs or Organizations: Yes   Attends Archivist Meetings: More than 4 times per year   Marital Status: Divorced   Vitals:   10/23/21 1134   BP: 120/70  Pulse: 60  Resp: 16  SpO2: 99%   Body mass index is 29.58 kg/m.  Physical Exam Vitals and nursing note reviewed.  Constitutional:      General: She is not in acute distress.    Appearance: She is well-developed.  HENT:     Head: Normocephalic and atraumatic.     Mouth/Throat:     Mouth: Mucous membranes are moist.  Eyes:     Conjunctiva/sclera: Conjunctivae normal.  Cardiovascular:     Rate and Rhythm: Normal rate and regular rhythm.     Pulses:          Radial pulses are 2+ on the right side and 2+ on the left side.     Heart sounds: No murmur heard.    Comments: DP pulses present. Pulmonary:     Effort: Pulmonary effort is normal. No respiratory distress.     Breath sounds: Normal breath sounds.  Abdominal:     Palpations: Abdomen is soft. There is no hepatomegaly or mass.  Skin:    General: Skin is warm.     Findings: No erythema or rash.  Neurological:     General: No focal deficit present.     Mental Status: She is alert and oriented to person, place, and time.     Gait: Gait normal.  Psychiatric:     Comments: Well groomed, good eye contact.   ASSESSMENT AND PLAN:  [name] was seen today for cold finger tips.  Diagnoses and all orders for this visit: Orders Placed This Encounter  Procedures   Hemoglobin A1c   C-reactive protein   TSH   Sedimentation rate   Basic metabolic panel   CBC   Lab Results  Component Value Date   HGBA1C 6.0 10/23/2021   Lab Results  Component Value Date   TSH 1.48 10/23/2021   Lab Results  Component Value Date   CRP <1.0 10/23/2021   Lab Results  Component Value Date   ESRSEDRATE 26 10/23/2021   Lab Results  Component Value Date   WBC 5.2 10/23/2021   HGB 12.2 10/23/2021   HCT 38.5 10/23/2021   MCV 85.7 10/23/2021   PLT 298.0 10/23/2021   Lab Results  Component Value Date   CREATININE 0.78 10/23/2021   BUN 17 10/23/2021   NA 139 10/23/2021   K 4.1 10/23/2021   CL 103 10/23/2021   CO2  32 10/23/2021   Type 2 diabetes mellitus with other specified complication, without long-term current use of insulin ([place]) HgA1C has been at goal. Continue non pharmacologic treatment. Regular exercise and healthy diet with avoidance of added sugar food intake is an important part of treatment and encouraged. Annual eye exam, periodic dental and foot care recommended. F/U in 5-6 months  Sensation of cold in finger Hx and examination do not suggest a serious process. We discussed possible etiologies. Appropriate skin care discussed. Instructed about warning signs. Further recommendations according to lab results.  Return in about 4 months (around 02/20/2022), or if symptoms worsen or fail to improve, for can move follow up to May or June..   Patria Warzecha G. [place], MD  [facility]. [place] office.

## 2021-10-23 ENCOUNTER — Encounter: Payer: Self-pay | Admitting: Family Medicine

## 2021-10-23 ENCOUNTER — Ambulatory Visit: Payer: Medicare PPO | Admitting: Family Medicine

## 2021-10-23 VITALS — BP 120/70 | HR 60 | Resp 16 | Ht 63.0 in | Wt 167.0 lb

## 2021-10-23 DIAGNOSIS — E1169 Type 2 diabetes mellitus with other specified complication: Secondary | ICD-10-CM | POA: Diagnosis not present

## 2021-10-23 DIAGNOSIS — R209 Unspecified disturbances of skin sensation: Secondary | ICD-10-CM

## 2021-10-23 LAB — BASIC METABOLIC PANEL
BUN: 17 mg/dL (ref 6–23)
CO2: 32 mEq/L (ref 19–32)
Calcium: 9.5 mg/dL (ref 8.4–10.5)
Chloride: 103 mEq/L (ref 96–112)
Creatinine, Ser: 0.78 mg/dL (ref 0.40–1.20)
GFR: 78.34 mL/min (ref >60.00)
Glucose, Bld: 91 mg/dL (ref 70–99)
Potassium: 4.1 mEq/L (ref 3.5–5.1)
Sodium: 139 mEq/L (ref 135–145)

## 2021-10-23 LAB — CBC
HCT: 38.5 % (ref 36.0–46.0)
Hemoglobin: 12.2 g/dL (ref 12.0–15.0)
MCHC: 31.7 g/dL (ref 30.0–36.0)
MCV: 85.7 fl (ref 78.0–100.0)
Platelets: 298 10*3/uL (ref 150.0–400.0)
RBC: 4.49 Mil/uL (ref 3.87–5.11)
RDW: 13.7 % (ref 11.5–15.5)
WBC: 5.2 10*3/uL (ref 4.0–10.5)

## 2021-10-23 LAB — SEDIMENTATION RATE: Sed Rate: 26 mm/hr (ref 0–30)

## 2021-10-23 LAB — C-REACTIVE PROTEIN: CRP: <1 mg/dL (ref 0.5–20.0)

## 2021-10-23 LAB — HEMOGLOBIN A1C: Hgb A1c MFr Bld: 6 % (ref 4.6–6.5)

## 2021-10-23 LAB — TSH: TSH: 1.48 u[IU]/mL (ref 0.35–5.50)

## 2021-10-23 NOTE — Patient Instructions (Addendum)
A few things to remember from today's visit:  Type 2 diabetes mellitus with other specified complication, without long-term current use of insulin ([place]) - Plan: Hemoglobin J6E, Basic metabolic panel  Sensation of cold in finger - Plan: C-reactive protein, TSH, Sedimentation rate, CBC  If you need refills please call your pharmacy. Do not use My Chart to request refills or for acute issues that need immediate attention.   For constipation increase fiber intake and water intake. Miralax at bedtime if needed. Cold fingers does not seem to be worrisome, still avoid trauma, keep hands warm,and monitor for skin lesions.  Please be sure medication list is accurate. If a new problem present, please set up appointment sooner than planned today.

## 2021-12-24 ENCOUNTER — Encounter: Payer: Medicare PPO | Admitting: Family Medicine

## 2022-02-25 ENCOUNTER — Encounter: Payer: Medicare PPO | Admitting: Family Medicine

## 2022-03-27 NOTE — Progress Notes (Unsigned)
HPI: Janice Williamson is a 66 y.o. female, who is here today for her routine physical.  Last CPE: 12/22/20  She exercises regularly, walks 4 miles daily and wt lifting a few times per week. Following a healthy diet: She cooks most of her meals,plenty of vegetables, fruit. Yogurt with berries and nuts for breakfast.  Chronic medical problems: HLD,DM II,depression,and insomnia among some.  Immunization History  Administered Date(s) Administered   Fluad Quad(high Dose 65+) 06/10/2019   Influenza Split 07/29/2012   Influenza, Seasonal, Injecte, Preservative Fre 06/25/2018   Influenza,inj,Quad PF,6+ Mos 07/19/2016, 06/25/2018   Influenza-Unspecified 07/15/2014, 06/10/2017   MODERNA COVID-19 SARS-COV-2 PEDS BIVALENT BOOSTER 6Y-11Y 07/13/2021   Pneumococcal Conjugate-13 12/21/2020   Pneumococcal Polysaccharide-23 09/08/2019, 12/21/2020   Tdap 07/29/2012   Zoster Recombinat (Shingrix) 06/10/2019, 09/08/2019   Health Maintenance  Topic Date Due   COVID-19 Vaccine (2 - Moderna series) 04/14/2022 (Originally 11/13/2021)   INFLUENZA VACCINE  04/23/2022   TETANUS/TDAP  07/29/2022   HEMOGLOBIN A1C  09/29/2022   OPHTHALMOLOGY EXAM  03/15/2023   FOOT EXAM  03/30/2023   MAMMOGRAM  08/29/2023   COLONOSCOPY (Pts 45-26yr Insurance coverage will need to be confirmed)  07/19/2028   Pneumonia Vaccine 66 Years old  Completed   DEXA SCAN  Completed   Hepatitis C Screening  Completed   Zoster Vaccines- Shingrix  Completed   HPV VACCINES  Aged Out   She has no concerns today.  Diabetes Mellitus II:  Dx'ed in 08/2018 with HgA1C of 6.7. On non pharmacologic treatment. - Checking BG at home: No checking.  Lab Results  Component Value Date   HGBA1C 6.0 10/23/2021   Lab Results  Component Value Date   MICROALBUR <0.7 12/22/2020   Hyperlipidemia: Currently on no medication. She has declined statin. Lab Results  Component Value Date   CHOL 178 06/25/2021   HDL 61.20 06/25/2021    LDLCALC 109 (H) 06/25/2021   TRIG 40.0 06/25/2021   CHOLHDL 3 06/25/2021   Review of Systems  Constitutional:  Negative for activity change, appetite change and fever.  HENT:  Negative for hearing loss, mouth sores, sore throat and trouble swallowing.   Eyes:  Negative for redness and visual disturbance.  Respiratory:  Negative for cough, shortness of breath and wheezing.   Cardiovascular:  Negative for chest pain and leg swelling.  Gastrointestinal:  Negative for abdominal pain, nausea and vomiting.       No changes in bowel habits.  Endocrine: Negative for cold intolerance, heat intolerance, polydipsia, polyphagia and polyuria.  Genitourinary:  Negative for decreased urine volume, dysuria, hematuria, vaginal bleeding and vaginal discharge.  Musculoskeletal:  Negative for gait problem and myalgias.  Skin:  Negative for color change and rash.  Allergic/Immunologic: Negative for environmental allergies.  Neurological:  Negative for seizures, syncope, weakness and headaches.  Hematological:  Negative for adenopathy. Does not bruise/bleed easily.  Psychiatric/Behavioral:  Negative for confusion. The patient is not nervous/anxious.   All other systems reviewed and are negative.  Current Outpatient Medications on File Prior to Visit  Medication Sig Dispense Refill   betamethasone, augmented, (DIPROLENE) 0.05 % lotion Apply to scalp 3 times per week. Not to face.     minoxidil (LONITEN) 2.5 MG tablet Take 0.5 tablets by mouth daily.     Multiple Vitamin (MULTIVITAMIN) capsule Take 1 capsule by mouth daily.     psyllium (METAMUCIL) 58.6 % powder Take 1 packet by mouth as needed.     No current facility-administered medications on  file prior to visit.   Past Medical History:  Diagnosis Date   Allergy    Cataract    removed both - lens implants   Hx of adenomatous polyp of colon 07/22/2016   Obesity    Prediabetes    no meds- A1C 6.0   Past Surgical History:  Procedure Laterality  Date   CATARACT EXTRACTION W/ INTRAOCULAR LENS IMPLANT     CESAREAN SECTION     COLONOSCOPY     KELOID EXCISION Left    age 35   POLYPECTOMY     TUBAL LIGATION     WISDOM TOOTH EXTRACTION     Allergies  Allergen Reactions   Almond Meal Anaphylaxis    Almonds    Almond Oil     Other reaction(s): Other (See Comments)   Penicillins     REACTION: hives    Family History  Problem Relation Age of Onset   Hypertension Mother    Diabetes Sister    Diabetes Son    Drug abuse Other    Diabetes Other    Colon cancer Neg Hx    Colon polyps Neg Hx    Esophageal cancer Neg Hx    Rectal cancer Neg Hx    Stomach cancer Neg Hx     Social History   Socioeconomic History   Marital status: Divorced    Spouse name: Not on file   Number of children: Not on file   Years of education: Not on file   Highest education level: Not on file  Occupational History   Not on file  Tobacco Use   Smoking status: Never   Smokeless tobacco: Never  Substance and Sexual Activity   Alcohol use: Yes    Comment: rare- very    Drug use: No   Sexual activity: Yes    Birth control/protection: Post-menopausal, Surgical  Other Topics Concern   Not on file  Social History Narrative   Not on file   Social Determinants of Health   Financial Resource Strain: Low Risk  (09/21/2021)   Overall Financial Resource Strain (CARDIA)    Difficulty of Paying Living Expenses: Not hard at all  Food Insecurity: No Food Insecurity (09/21/2021)   Hunger Vital Sign    Worried About Running Out of Food in the Last Year: Never true    [place] in the Last Year: Never true  Transportation Needs: No Transportation Needs (09/21/2021)   PRAPARE - Hydrologist (Medical): No    Lack of Transportation (Non-Medical): No  Physical Activity: Sufficiently Active (09/21/2021)   Exercise Vital Sign    Days of Exercise per Week: 7 days    Minutes of Exercise per Session: 60 min  Stress: No  Stress Concern Present (09/21/2021)   [place]    Feeling of Stress : Not at all  Social Connections: Moderately Integrated (09/21/2021)   Social Connection and Isolation Panel [NHANES]    Frequency of Communication with Friends and Family: More than three times a week    Frequency of Social Gatherings with Friends and Family: More than three times a week    Attends Religious Services: More than 4 times per year    Active Member of [employer] or Organizations: Yes    Attends Archivist Meetings: More than 4 times per year    Marital Status: Divorced   Vitals:   03/29/22 0804  BP: 120/60  Pulse: 86  Resp: 12  SpO2: 98%   Body mass index is 29.41 kg/m.  Wt Readings from Last 3 Encounters:  03/29/22 166 lb (75.3 kg)  10/23/21 167 lb (75.8 kg)  09/21/21 166 lb (75.3 kg)   Physical Exam Vitals and nursing note reviewed.  Constitutional:      General: She is not in acute distress.    Appearance: She is well-developed.  HENT:     Head: Normocephalic and atraumatic.     Right Ear: Tympanic membrane, ear canal and external ear normal.     Left Ear: External ear normal.     Ears:     Comments: Cerumen excess left ear canal, not impacted.Cannot see TM.    Mouth/Throat:     Mouth: Mucous membranes are moist.     Pharynx: Oropharynx is clear. Uvula midline.  Eyes:     Extraocular Movements: Extraocular movements intact.     Conjunctiva/sclera: Conjunctivae normal.     Pupils: Pupils are equal, round, and reactive to light.  Neck:     Thyroid: No thyromegaly.     Trachea: No tracheal deviation.  Cardiovascular:     Rate and Rhythm: Normal rate and regular rhythm.     Pulses:          Dorsalis pedis pulses are 2+ on the right side and 2+ on the left side.     Heart sounds: No murmur heard. Pulmonary:     Effort: Pulmonary effort is normal. No respiratory distress.     Breath sounds: Normal breath sounds.   Abdominal:     Palpations: Abdomen is soft. There is no hepatomegaly or mass.     Tenderness: There is no abdominal tenderness.  Musculoskeletal:     Comments: No signs of synovitis appreciated.  Lymphadenopathy:     Cervical: No cervical adenopathy.  Skin:    General: Skin is warm.     Findings: No erythema or rash.  Neurological:     General: No focal deficit present.     Mental Status: She is alert and oriented to person, place, and time.     Cranial Nerves: No cranial nerve deficit.     Coordination: Coordination normal.     Gait: Gait normal.     Deep Tendon Reflexes:     Reflex Scores:      Bicep reflexes are 2+ on the right side and 2+ on the left side.      Patellar reflexes are 2+ on the right side and 2+ on the left side. Psychiatric:     Comments: Well groomed, good eye contact.    Diabetic Foot Exam - Simple   Simple Foot Form Diabetic Foot exam was performed with the following findings: Yes 03/29/2022  8:34 AM  Visual Inspection No deformities, no ulcerations, no other skin breakdown bilaterally: Yes Sensation Testing Intact to touch and monofilament testing bilaterally: Yes Pulse Check Posterior Tibialis and Dorsalis pulse intact bilaterally: Yes Comments    ASSESSMENT AND PLAN:  Janice Williamson was here today annual physical examination.  Orders Placed This Encounter  Procedures   Comprehensive metabolic panel   Hemoglobin A1c   Lipid panel   Microalbumin / creatinine urine ratio   Lab Results  Component Value Date   CHOL 177 03/29/2022   HDL 67.50 03/29/2022   LDLCALC 103 (H) 03/29/2022   TRIG 35.0 03/29/2022   CHOLHDL 3 03/29/2022   Lab Results  Component Value Date   MICROALBUR 1.0 03/29/2022  MICROALBUR <0.7 12/22/2020   Lab Results  Component Value Date   CREATININE 0.86 03/29/2022   BUN 14 03/29/2022   NA 141 03/29/2022   K 3.8 03/29/2022   CL 104 03/29/2022   CO2 29 03/29/2022   Lab Results  Component Value Date   ALT  17 03/29/2022   AST 28 03/29/2022   ALKPHOS 58 03/29/2022   BILITOT 0.5 03/29/2022   Lab Results  Component Value Date   HGBA1C 6.0 03/29/2022   Routine general medical examination at a health care facility We discussed the importance of regular physical activity and healthy diet for prevention of chronic illness and/or complications. Preventive guidelines reviewed. Vaccination up to date. Ca++ and vit D supplementation recommended. Next CPE in a year.  Type 2 diabetes mellitus with other specified complication ([place]) Her WOE3O has been at goal. Continue non pharmacologic treatment. Appropriate foot care and periodic eye exam to continue.  Hyperlipidemia She has declined statin, discussed CV benefits. Last LDL 109 in 06/2021. Continue low fat diet.  Return in 6 months (on 09/29/2022).  Duwane Gewirtz G. [place], MD  [facility]. [place] office.

## 2022-03-29 ENCOUNTER — Ambulatory Visit (INDEPENDENT_AMBULATORY_CARE_PROVIDER_SITE_OTHER): Payer: Medicare PPO | Admitting: Family Medicine

## 2022-03-29 ENCOUNTER — Encounter: Payer: Self-pay | Admitting: Family Medicine

## 2022-03-29 VITALS — BP 120/60 | HR 86 | Resp 12 | Ht 63.0 in | Wt 166.0 lb

## 2022-03-29 DIAGNOSIS — E785 Hyperlipidemia, unspecified: Secondary | ICD-10-CM | POA: Diagnosis not present

## 2022-03-29 DIAGNOSIS — E1169 Type 2 diabetes mellitus with other specified complication: Secondary | ICD-10-CM | POA: Diagnosis not present

## 2022-03-29 DIAGNOSIS — Z0000 Encounter for general adult medical examination without abnormal findings: Secondary | ICD-10-CM | POA: Diagnosis not present

## 2022-03-29 DIAGNOSIS — Z5320 Procedure and treatment not carried out because of patient's decision for unspecified reasons: Secondary | ICD-10-CM

## 2022-03-29 LAB — COMPREHENSIVE METABOLIC PANEL
ALT: 17 U/L (ref 0–35)
AST: 28 U/L (ref 0–37)
Albumin: 4.1 g/dL (ref 3.5–5.2)
Alkaline Phosphatase: 58 U/L (ref 39–117)
BUN: 14 mg/dL (ref 6–23)
CO2: 29 mEq/L (ref 19–32)
Calcium: 9.3 mg/dL (ref 8.4–10.5)
Chloride: 104 mEq/L (ref 96–112)
Creatinine, Ser: 0.86 mg/dL (ref 0.40–1.20)
GFR: 69.47 mL/min (ref >60.00)
Glucose, Bld: 90 mg/dL (ref 70–99)
Potassium: 3.8 mEq/L (ref 3.5–5.1)
Sodium: 141 mEq/L (ref 135–145)
Total Bilirubin: 0.5 mg/dL (ref 0.2–1.2)
Total Protein: 7 g/dL (ref 6.0–8.3)

## 2022-03-29 LAB — LIPID PANEL
Cholesterol: 177 mg/dL (ref 0–200)
HDL: 67.5 mg/dL (ref >39.00)
LDL Cholesterol: 103 mg/dL — ABNORMAL HIGH (ref 0–99)
NonHDL: 109.9
Total CHOL/HDL Ratio: 3
Triglycerides: 35 mg/dL (ref 0.0–149.0)
VLDL: 7 mg/dL (ref 0.0–40.0)

## 2022-03-29 LAB — MICROALBUMIN / CREATININE URINE RATIO
Creatinine,U: 217.3 mg/dL
Microalb Creat Ratio: 0.4 mg/g (ref 0.0–30.0)
Microalb, Ur: 1 mg/dL (ref 0.0–1.9)

## 2022-03-29 LAB — HEMOGLOBIN A1C: Hgb A1c MFr Bld: 6 % (ref 4.6–6.5)

## 2022-03-29 NOTE — Assessment & Plan Note (Signed)
She has declined statin, discussed CV benefits. Last LDL 109 in 06/2021. Continue low fat diet.

## 2022-03-29 NOTE — Patient Instructions (Addendum)
A few things to remember from today's visit:  Routine general medical examination at a health care facility  Hyperlipidemia, unspecified hyperlipidemia type - Plan: Comprehensive metabolic panel, Lipid panel  Type 2 diabetes mellitus with other specified complication, without long-term current use of insulin ([place]) - Plan: Comprehensive metabolic panel, Hemoglobin A1c, Microalbumin / creatinine urine ratio  If you need refills please call your pharmacy. Do not use My Chart to request refills or for acute issues that need immediate attention.   Please be sure medication list is accurate. If a new problem present, please set up appointment sooner than planned today.  Preventive Care 35 Years and Older, Female Preventive care refers to lifestyle choices and visits with your health care provider that can promote health and wellness. Preventive care visits are also called wellness exams. What can I expect for my preventive care visit? Counseling Your health care provider may ask you questions about your: Medical history, including: Past medical problems. Family medical history. Pregnancy and menstrual history. History of falls. Current health, including: Memory and ability to understand (cognition). Emotional well-being. Home life and relationship well-being. Sexual activity and sexual health. Lifestyle, including: Alcohol, nicotine or tobacco, and drug use. Access to firearms. Diet, exercise, and sleep habits. Work and work Statistician. Sunscreen use. Safety issues such as seatbelt and bike helmet use. Physical exam Your health care provider will check your: Height and weight. These may be used to calculate your BMI (body mass index). BMI is a measurement that tells if you are at a healthy weight. Waist circumference. This measures the distance around your waistline. This measurement also tells if you are at a healthy weight and may help predict your risk of certain diseases, such  as type 2 diabetes and high blood pressure. Heart rate and blood pressure. Body temperature. Skin for abnormal spots. What immunizations do I need?  Vaccines are usually given at various ages, according to a schedule. Your health care provider will recommend vaccines for you based on your age, medical history, and lifestyle or other factors, such as travel or where you work. What tests do I need? Screening Your health care provider may recommend screening tests for certain conditions. This may include: Lipid and cholesterol levels. Hepatitis C test. Hepatitis B test. HIV (human immunodeficiency virus) test. STI (sexually transmitted infection) testing, if you are at risk. Lung cancer screening. Colorectal cancer screening. Diabetes screening. This is done by checking your blood sugar (glucose) after you have not eaten for a while (fasting). Mammogram. Talk with your health care provider about how often you should have regular mammograms. BRCA-related cancer screening. This may be done if you have a family history of breast, ovarian, tubal, or peritoneal cancers. Bone density scan. This is done to screen for osteoporosis. Talk with your health care provider about your test results, treatment options, and if necessary, the need for more tests. Follow these instructions at home: Eating and drinking  Eat a diet that includes fresh fruits and vegetables, whole grains, lean protein, and low-fat dairy products. Limit your intake of foods with high amounts of sugar, saturated fats, and salt. Take vitamin and mineral supplements as recommended by your health care provider. Do not drink alcohol if your health care provider tells you not to drink. If you drink alcohol: Limit how much you have to 0-1 drink a day. Know how much alcohol is in your drink. In the [place] one drink equals one 12 oz bottle of beer (355 mL), one 5 oz  glass of wine (148 mL), or one 1 oz glass of hard liquor (44  mL). Lifestyle Brush your teeth every morning and night with fluoride toothpaste. Floss one time each day. Exercise for at least 30 minutes 5 or more days each week. Do not use any products that contain nicotine or tobacco. These products include cigarettes, chewing tobacco, and vaping devices, such as e-cigarettes. If you need help quitting, ask your health care provider. Do not use drugs. If you are sexually active, practice safe sex. Use a condom or other form of protection in order to prevent STIs. Take aspirin only as told by your health care provider. Make sure that you understand how much to take and what form to take. Work with your health care provider to find out whether it is safe and beneficial for you to take aspirin daily. Ask your health care provider if you need to take a cholesterol-lowering medicine (statin). Find healthy ways to manage stress, such as: Meditation, yoga, or listening to music. Journaling. Talking to a trusted person. Spending time with friends and family. Minimize exposure to UV radiation to reduce your risk of skin cancer. Safety Always wear your seat belt while driving or riding in a vehicle. Do not drive: If you have been drinking alcohol. Do not ride with someone who has been drinking. When you are tired or distracted. While texting. If you have been using any mind-altering substances or drugs. Wear a helmet and other protective equipment during sports activities. If you have firearms in your house, make sure you follow all gun safety procedures. What's next? Visit your health care provider once a year for an annual wellness visit. Ask your health care provider how often you should have your eyes and teeth checked. Stay up to date on all vaccines. This information is not intended to replace advice given to you by your health care provider. Make sure you discuss any questions you have with your health care provider. Document Revised: 03/07/2021  Document Reviewed: 03/07/2021 Elsevier Patient Education  [place].

## 2022-03-29 NOTE — Assessment & Plan Note (Signed)
Her HgA1C has been at goal. Continue non pharmacologic treatment. Appropriate foot care and periodic eye exam to continue.

## 2022-08-26 DIAGNOSIS — Z1231 Encounter for screening mammogram for malignant neoplasm of breast: Secondary | ICD-10-CM | POA: Diagnosis not present

## 2022-08-26 LAB — HM MAMMOGRAPHY

## 2022-08-27 ENCOUNTER — Encounter: Payer: Self-pay | Admitting: Family Medicine

## 2022-08-29 DIAGNOSIS — L658 Other specified nonscarring hair loss: Secondary | ICD-10-CM | POA: Diagnosis not present

## 2022-08-29 DIAGNOSIS — B079 Viral wart, unspecified: Secondary | ICD-10-CM | POA: Diagnosis not present

## 2022-08-29 DIAGNOSIS — L668 Other cicatricial alopecia: Secondary | ICD-10-CM | POA: Diagnosis not present

## 2022-10-03 ENCOUNTER — Ambulatory Visit (INDEPENDENT_AMBULATORY_CARE_PROVIDER_SITE_OTHER): Payer: Medicare PPO

## 2022-10-03 VITALS — Ht 63.0 in | Wt 166.0 lb

## 2022-10-03 DIAGNOSIS — Z0000 Encounter for general adult medical examination without abnormal findings: Secondary | ICD-10-CM

## 2022-10-03 NOTE — Patient Instructions (Addendum)
Janice Williamson , Thank you for taking time to come for your Medicare Wellness Visit. I appreciate your ongoing commitment to your health goals. Please review the following plan we discussed and let me know if I can assist you in the future.   These are the goals we discussed:  Goals       Patient stated (pt-stated)      I want ti finish school      Weight (lb) < 200 lb (90.7 kg)      Patient states want to maintain good eating habits.        This is a list of the screening recommended for you and due dates:  Health Maintenance  Topic Date Due   DTaP/Tdap/Td vaccine (2 - Td or Tdap) 07/29/2022   Hemoglobin A1C  09/29/2022   COVID-19 Vaccine (2 - 2023-24 season) 10/19/2022*   Eye exam for diabetics  03/15/2023   Yearly kidney function blood test for diabetes  03/30/2023   Yearly kidney health urinalysis for diabetes  03/30/2023   Complete foot exam   03/30/2023   Medicare Annual Wellness Visit  10/04/2023   Mammogram  08/26/2024   Colon Cancer Screening  07/19/2028   Pneumonia Vaccine  Completed   Flu Shot  Completed   DEXA scan (bone density measurement)  Completed   Hepatitis C Screening: USPSTF Recommendation to screen - Ages 67-79 yo.  Completed   Zoster (Shingles) Vaccine  Completed   HPV Vaccine  Aged Out  *Topic was postponed. The date shown is not the original due date.    Advanced directives: Advance directive discussed with you today. Even though you declined this today, please call our office should you change your mind, and we can give you the proper paperwork for you to fill out.   Conditions/risks identified: None  Next appointment: Follow up in one year for your annual wellness visit     Preventive Care 65 Years and Older, Female Preventive care refers to lifestyle choices and visits with your health care provider that can promote health and wellness. What does preventive care include? A yearly physical exam. This is also called an annual well check. Dental  exams once or twice a year. Routine eye exams. Ask your health care provider how often you should have your eyes checked. Personal lifestyle choices, including: Daily care of your teeth and gums. Regular physical activity. Eating a healthy diet. Avoiding tobacco and drug use. Limiting alcohol use. Practicing safe sex. Taking low-dose aspirin every day. Taking vitamin and mineral supplements as recommended by your health care provider. What happens during an annual well check? The services and screenings done by your health care provider during your annual well check will depend on your age, overall health, lifestyle risk factors, and family history of disease. Counseling  Your health care provider may ask you questions about your: Alcohol use. Tobacco use. Drug use. Emotional well-being. Home and relationship well-being. Sexual activity. Eating habits. History of falls. Memory and ability to understand (cognition). Work and work Statistician. Reproductive health. Screening  You may have the following tests or measurements: Height, weight, and BMI. Blood pressure. Lipid and cholesterol levels. These may be checked every 5 years, or more frequently if you are over 95 years old. Skin check. Lung cancer screening. You may have this screening every year starting at age 77 if you have a 30-pack-year history of smoking and currently smoke or have quit within the past 15 years. Fecal occult blood test (FOBT)  of the stool. You may have this test every year starting at age 82. Flexible sigmoidoscopy or colonoscopy. You may have a sigmoidoscopy every 5 years or a colonoscopy every 10 years starting at age 35. Hepatitis C blood test. Hepatitis B blood test. Sexually transmitted disease (STD) testing. Diabetes screening. This is done by checking your blood sugar (glucose) after you have not eaten for a while (fasting). You may have this done every 1-3 years. Bone density scan. This is done  to screen for osteoporosis. You may have this done starting at age 96. Mammogram. This may be done every 1-2 years. Talk to your health care provider about how often you should have regular mammograms. Talk with your health care provider about your test results, treatment options, and if necessary, the need for more tests. Vaccines  Your health care provider may recommend certain vaccines, such as: Influenza vaccine. This is recommended every year. Tetanus, diphtheria, and acellular pertussis (Tdap, Td) vaccine. You may need a Td booster every 10 years. Zoster vaccine. You may need this after age 19. Pneumococcal 13-valent conjugate (PCV13) vaccine. One dose is recommended after age 90. Pneumococcal polysaccharide (PPSV23) vaccine. One dose is recommended after age 63. Talk to your health care provider about which screenings and vaccines you need and how often you need them. This information is not intended to replace advice given to you by your health care provider. Make sure you discuss any questions you have with your health care provider. Document Released: 10/06/2015 Document Revised: 05/29/2016 Document Reviewed: 07/11/2015 Elsevier Interactive Patient Education  2017 [place] Prevention in the Home Falls can cause injuries. They can happen to people of all ages. There are many things you can do to make your home safe and to help prevent falls. What can I do on the outside of my home? Regularly fix the edges of walkways and driveways and fix any cracks. Remove anything that might make you trip as you walk through a door, such as a raised step or threshold. Trim any bushes or trees on the path to your home. Use bright outdoor lighting. Clear any walking paths of anything that might make someone trip, such as rocks or tools. Regularly check to see if handrails are loose or broken. Make sure that both sides of any steps have handrails. Any raised decks and porches should have  guardrails on the edges. Have any leaves, snow, or ice cleared regularly. Use sand or salt on walking paths during winter. Clean up any spills in your garage right away. This includes oil or grease spills. What can I do in the bathroom? Use night lights. Install grab bars by the toilet and in the tub and shower. Do not use towel bars as grab bars. Use non-skid mats or decals in the tub or shower. If you need to sit down in the shower, use a plastic, non-slip stool. Keep the floor dry. Clean up any water that spills on the floor as soon as it happens. Remove soap buildup in the tub or shower regularly. Attach bath mats securely with double-sided non-slip rug tape. Do not have throw rugs and other things on the floor that can make you trip. What can I do in the bedroom? Use night lights. Make sure that you have a light by your bed that is easy to reach. Do not use any sheets or blankets that are too big for your bed. They should not hang down onto the floor. Have a firm  chair that has side arms. You can use this for support while you get dressed. Do not have throw rugs and other things on the floor that can make you trip. What can I do in the kitchen? Clean up any spills right away. Avoid walking on wet floors. Keep items that you use a lot in easy-to-reach places. If you need to reach something above you, use a strong step stool that has a grab bar. Keep electrical cords out of the way. Do not use floor polish or wax that makes floors slippery. If you must use wax, use non-skid floor wax. Do not have throw rugs and other things on the floor that can make you trip. What can I do with my stairs? Do not leave any items on the stairs. Make sure that there are handrails on both sides of the stairs and use them. Fix handrails that are broken or loose. Make sure that handrails are as long as the stairways. Check any carpeting to make sure that it is firmly attached to the stairs. Fix any carpet  that is loose or worn. Avoid having throw rugs at the top or bottom of the stairs. If you do have throw rugs, attach them to the floor with carpet tape. Make sure that you have a light switch at the top of the stairs and the bottom of the stairs. If you do not have them, ask someone to add them for you. What else can I do to help prevent falls? Wear shoes that: Do not have high heels. Have rubber bottoms. Are comfortable and fit you well. Are closed at the toe. Do not wear sandals. If you use a stepladder: Make sure that it is fully opened. Do not climb a closed stepladder. Make sure that both sides of the stepladder are locked into place. Ask someone to hold it for you, if possible. Clearly mark and make sure that you can see: Any grab bars or handrails. First and last steps. Where the edge of each step is. Use tools that help you move around (mobility aids) if they are needed. These include: Canes. Walkers. Scooters. Crutches. Turn on the lights when you go into a dark area. Replace any light bulbs as soon as they burn out. Set up your furniture so you have a clear path. Avoid moving your furniture around. If any of your floors are uneven, fix them. If there are any pets around you, be aware of where they are. Review your medicines with your doctor. Some medicines can make you feel dizzy. This can increase your chance of falling. Ask your doctor what other things that you can do to help prevent falls. This information is not intended to replace advice given to you by your health care provider. Make sure you discuss any questions you have with your health care provider. Document Released: 07/06/2009 Document Revised: 02/15/2016 Document Reviewed: 10/14/2014 Elsevier Interactive Patient Education  2017 [employer].

## 2022-10-03 NOTE — Progress Notes (Addendum)
[place]  Subjective:   Janice Williamson is a 67 y.o. female who presents for Medicare Annual (Subsequent) preventive examination.  Review of Systems    Virtual Visit via Telephone Note  I connected with  Janice Williamson on 10/03/22 at  2:15 PM EST by telephone and verified that I am speaking with the correct person using two identifiers.  Location: Patient: Home  Provider: Office Persons participating in the virtual visit: patient/Nurse Health Advisor   I discussed the limitations, risks, security and privacy concerns of performing an evaluation and management service by telephone and the availability of in person appointments. The patient expressed understanding and agreed to proceed.  Interactive audio and video telecommunications were attempted between this nurse and patient, however failed, due to patient having technical difficulties OR patient did not have access to video capability.  We continued and completed visit with audio only.  Some vital signs may be absent or patient reported.   Criselda Peaches, LPN  Cardiac Risk Factors include: advanced age (>43mn, >>48women)     Objective:    Today's Vitals   10/03/22 1422  Weight: 166 lb (75.3 kg)  Height: '5\' 3"'$  (1.6 m)   Body mass index is 29.41 kg/m.     10/03/2022    2:27 PM 09/21/2021    9:07 AM 02/26/2017    7:11 PM 07/09/2016   10:44 AM 06/25/2016   10:05 AM  Advanced Directives  Does Patient Have a Medical Advance Directive? No No No No No  Would patient like information on creating a medical advance directive? No - Patient declined No - Patient declined       Current Medications (verified) Outpatient Encounter Medications as of 10/03/2022  Medication Sig   betamethasone, augmented, (DIPROLENE) 0.05 % lotion Apply to scalp 3 times per week. Not to face.   minoxidil (LONITEN) 2.5 MG tablet Take 0.5 tablets by mouth daily.   Multiple Vitamin (MULTIVITAMIN) capsule Take 1 capsule by mouth daily.   psyllium  (METAMUCIL) 58.6 % powder Take 1 packet by mouth as needed.   No facility-administered encounter medications on file as of 10/03/2022.    Allergies (verified) Almond meal, Almond oil, and Penicillins   History: Past Medical History:  Diagnosis Date   Allergy    Cataract    removed both - lens implants   Hx of adenomatous polyp of colon 07/22/2016   Obesity    Prediabetes    no meds- A1C 6.0   Past Surgical History:  Procedure Laterality Date   CATARACT EXTRACTION W/ INTRAOCULAR LENS IMPLANT     CESAREAN SECTION     COLONOSCOPY     KELOID EXCISION Left    age 67  POLYPECTOMY     TUBAL LIGATION     WISDOM TOOTH EXTRACTION     Family History  Problem Relation Age of Onset   Hypertension Mother    Diabetes Sister    Diabetes Son    Drug abuse Other    Diabetes Other    Colon cancer Neg Hx    Colon polyps Neg Hx    Esophageal cancer Neg Hx    Rectal cancer Neg Hx    Stomach cancer Neg Hx    Social History   Socioeconomic History   Marital status: Divorced    Spouse name: Not on file   Number of children: Not on file   Years of education: Not on file   Highest education level: Not on file  Occupational History   Not on file  Tobacco Use   Smoking status: Never   Smokeless tobacco: Never  Substance and Sexual Activity   Alcohol use: Yes    Comment: rare- very    Drug use: No   Sexual activity: Yes    Birth control/protection: Post-menopausal, Surgical  Other Topics Concern   Not on file  Social History Narrative   Not on file   Social Determinants of Health   Financial Resource Strain: Low Risk  (10/03/2022)   Overall Financial Resource Strain (CARDIA)    Difficulty of Paying Living Expenses: Not hard at all  Food Insecurity: No Food Insecurity (10/03/2022)   Hunger Vital Sign    Worried About Running Out of Food in the Last Year: Never true    Ran Out of Food in the Last Year: Never true  Transportation Needs: No Transportation Needs (09/21/2021)    PRAPARE - Hydrologist (Medical): No    Lack of Transportation (Non-Medical): No  Physical Activity: Sufficiently Active (10/03/2022)   Exercise Vital Sign    Days of Exercise per Week: 6 days    Minutes of Exercise per Session: 60 min  Stress: No Stress Concern Present (10/03/2022)   [place]    Feeling of Stress : Not at all  Social Connections: Moderately Integrated (10/03/2022)   Social Connection and Isolation Panel [NHANES]    Frequency of Communication with Friends and Family: More than three times a week    Frequency of Social Gatherings with Friends and Family: More than three times a week    Attends Religious Services: More than 4 times per year    Active Member of [employer] or Organizations: Yes    Attends Music therapist: More than 4 times per year    Marital Status: Divorced    Tobacco Counseling Counseling given: Not Answered   Clinical Intake:  Pre-visit preparation completed: No  Pain : No/denies pain     BMI - recorded: 29.41 Nutritional Status: BMI 25 -29 Overweight Nutritional Risks: None Diabetes: No  How often do you need to have someone help you when you read instructions, pamphlets, or other written materials from your doctor or pharmacy?: 1 - Never  Diabetic?  No  Interpreter Needed?: No  Information entered by :: Rolene Arbour LPN   Activities of Daily Living    10/03/2022    2:26 PM  In your present state of health, do you have any difficulty performing the following activities:  Hearing? 0  Vision? 0  Difficulty concentrating or making decisions? 0  Walking or climbing stairs? 0  Dressing or bathing? 0  Doing errands, shopping? 0  Preparing Food and eating ? N  Using the Toilet? N  In the past six months, have you accidently leaked urine? N  Do you have problems with loss of bowel control? N  Managing your Medications? N   Managing your Finances? N  Housekeeping or managing your Housekeeping? N    Patient Care Team: [place], Betty G, MD as PCP - General (Family Medicine)  Indicate any recent Medical Services you may have received from other than [facility] providers in the past year (date may be approximate).     Assessment:   This is a routine wellness examination for [place].  Hearing/Vision screen Hearing Screening - Comments:: Denies hearing difficulties   Vision Screening - Comments:: Wears rx glasses - up to date with  routine eye exams with  [facility]  Dietary issues and exercise activities discussed: Exercise limited by: None identified   Goals Addressed               This Visit's Progress     Patient stated (pt-stated)        I want ti finish school       Depression Screen    10/03/2022    2:25 PM 03/29/2022    8:08 AM 10/23/2021   11:42 AM 09/21/2021    9:01 AM 12/22/2020    7:26 AM 09/08/2019   10:11 AM 07/21/2018    8:56 AM  PHQ 2/9 Scores  PHQ - 2 Score 0 0 0 0 0 0 0    Fall Risk    10/03/2022    2:27 PM 03/29/2022    8:08 AM 09/21/2021    9:04 AM 12/22/2020    7:26 AM  Fall Risk   Falls in the past year? 0 0 0 0  Number falls in past yr: 0 0 0   Injury with Fall? 0 0 0   Risk for fall due to : No Fall Risks No Fall Risks    Follow up Falls prevention discussed Falls evaluation completed      FALL RISK PREVENTION PERTAINING TO THE HOME:  Any stairs in or around the home? No  If so, are there any without handrails? No  Home free of loose throw rugs in walkways, pet beds, electrical cords, etc? Yes  Adequate lighting in your home to reduce risk of falls? Yes   ASSISTIVE DEVICES UTILIZED TO PREVENT FALLS:  Life alert? No  Use of a cane, walker or w/c? No  Grab bars in the bathroom? Yes  Shower chair or bench in shower? No  Elevated toilet seat or a handicapped toilet? No   TIMED UP AND GO:  Was the test performed? No . Audio Visit    Cognitive Function:         10/03/2022    2:27 PM 09/21/2021    9:05 AM  6CIT Screen  What Year? 0 points 0 points  What month? 0 points 0 points  What time? 0 points 0 points  Count back from 20 0 points 0 points  Months in reverse 0 points 0 points  Repeat phrase 0 points 0 points  Total Score 0 points 0 points    Immunizations Immunization History  Administered Date(s) Administered   Fluad Quad(high Dose 65+) 06/10/2019, 06/19/2022   Influenza Split 07/29/2012   Influenza, Seasonal, Injecte, Preservative Fre 06/25/2018   Influenza,inj,Quad PF,6+ Mos 07/19/2016, 06/25/2018   Influenza-Unspecified 07/15/2014, 06/10/2017   MODERNA COVID-19 SARS-COV-2 PEDS BIVALENT BOOSTER 6Y-11Y 07/13/2021   Pneumococcal Conjugate-13 12/21/2020   Pneumococcal Polysaccharide-23 09/08/2019, 12/21/2020   Tdap 07/29/2012   Zoster Recombinat (Shingrix) 06/10/2019, 09/08/2019    TDAP status: Due, Education has been provided regarding the importance of this vaccine. Advised may receive this vaccine at local pharmacy or Health Dept. Aware to provide a copy of the vaccination record if obtained from local pharmacy or Health Dept. Verbalized acceptance and understanding.  Flu Vaccine status: Up to date  Pneumococcal vaccine status: Up to date  Covid-19 vaccine status: Completed vaccines  Qualifies for Shingles Vaccine? Yes   Zostavax completed Yes   Shingrix Completed?: Yes  Screening Tests Health Maintenance  Topic Date Due   DTaP/Tdap/Td (2 - Td or Tdap) 07/29/2022   HEMOGLOBIN A1C  09/29/2022   COVID-19 Vaccine (2 -  2023-24 season) 10/19/2022 (Originally 05/24/2022)   OPHTHALMOLOGY EXAM  03/15/2023   Diabetic kidney evaluation - eGFR measurement  03/30/2023   Diabetic kidney evaluation - Urine ACR  03/30/2023   FOOT EXAM  03/30/2023   Medicare Annual Wellness (AWV)  10/04/2023   MAMMOGRAM  08/26/2024   COLONOSCOPY (Pts 45-49yr Insurance coverage will need to be confirmed)  07/19/2028   Pneumonia Vaccine 69  Years old  Completed   INFLUENZA VACCINE  Completed   DEXA SCAN  Completed   Hepatitis C Screening  Completed   Zoster Vaccines- Shingrix  Completed   HPV VACCINES  Aged Out    Health Maintenance  Health Maintenance Due  Topic Date Due   DTaP/Tdap/Td (2 - Td or Tdap) 07/29/2022   HEMOGLOBIN A1C  09/29/2022    Colorectal cancer screening: Type of screening: Colonoscopy. Completed 07/19/21. Repeat every 7 years  Mammogram status: Completed 08/26/22. Repeat every year  Bone Density status: Completed 01/01/21. Results reflect: Bone density results: OSTEOPOROSIS. Repeat every   years.  Lung Cancer Screening: (Low Dose CT Chest recommended if Age 69-80years, 30 pack-year currently smoking OR have quit w/in 15years.) does not qualify.     Additional Screening:  Hepatitis C Screening: does qualify; Completed 08/24/18  Vision Screening: Recommended annual ophthalmology exams for early detection of glaucoma and other disorders of the eye. Is the patient up to date with their annual eye exam?  Yes  Who is the provider or what is the name of the office in which the patient attends annual eye exams? [facility] If pt is not established with a provider, would they like to be referred to a provider to establish care? No .   Dental Screening: Recommended annual dental exams for proper oral hygiene  Community Resource Referral / Chronic Care Management:  CRR required this visit?  No   CCM required this visit?  No      Plan:     I have personally reviewed and noted the following in the patient's chart:   Medical and social history Use of alcohol, tobacco or illicit drugs  Current medications and supplements including opioid prescriptions. Patient is not currently taking opioid prescriptions. Functional ability and status Nutritional status Physical activity Advanced directives List of other physicians Hospitalizations, surgeries, and ER visits in previous 12  months Vitals Screenings to include cognitive, depression, and falls Referrals and appointments  In addition, I have reviewed and discussed with patient certain preventive protocols, quality metrics, and best practice recommendations. A written personalized care plan for preventive services as well as general preventive health recommendations were provided to patient.     BCriselda Peaches LPN   10/34/9179  Nurse Notes: None

## 2022-10-07 ENCOUNTER — Telehealth: Payer: Self-pay

## 2022-10-07 NOTE — Telephone Encounter (Signed)
Unsuccessful attempt to reach patient x3 on preferred number listed in notes for scheduled AWV. Left message on voicemail okay to rescheduled. Patient returned call at 3:01 pm stated unable to completed visit today will call back to rescheduled.

## 2023-03-17 LAB — HM DIABETES EYE EXAM

## 2023-09-01 DIAGNOSIS — Z1231 Encounter for screening mammogram for malignant neoplasm of breast: Secondary | ICD-10-CM | POA: Diagnosis not present

## 2023-09-01 LAB — HM MAMMOGRAPHY

## 2023-09-11 DIAGNOSIS — L658 Other specified nonscarring hair loss: Secondary | ICD-10-CM | POA: Diagnosis not present

## 2023-09-11 DIAGNOSIS — L6681 Central centrifugal cicatricial alopecia: Secondary | ICD-10-CM | POA: Diagnosis not present

## 2023-09-16 ENCOUNTER — Encounter: Payer: Self-pay | Admitting: Family Medicine

## 2023-09-16 NOTE — Telephone Encounter (Signed)
 Care team updated and letter sent for eye exam notes.

## 2023-10-08 ENCOUNTER — Encounter: Payer: Self-pay | Admitting: Family Medicine

## 2023-10-15 ENCOUNTER — Ambulatory Visit: Payer: Medicare PPO

## 2023-10-15 VITALS — Ht 63.5 in | Wt 183.0 lb

## 2023-10-15 DIAGNOSIS — Z0000 Encounter for general adult medical examination without abnormal findings: Secondary | ICD-10-CM | POA: Diagnosis not present

## 2023-10-15 NOTE — Progress Notes (Signed)
Subjective:   Janice Williamson is a 68 y.o. female who presents for Medicare Annual (Subsequent) preventive examination.  Visit Complete: Virtual I connected with  Janice Williamson on 10/15/23 by a audio enabled telemedicine application and verified that I am speaking with the correct person using two identifiers.  Patient Location: Home  Provider Location: Home Office  I discussed the limitations of evaluation and management by telemedicine. The patient expressed understanding and agreed to proceed.  Vital Signs: Because this visit was a virtual/telehealth visit, some criteria may be missing or patient reported. Any vitals not documented were not able to be obtained and vitals that have been documented are patient reported.  Patient Medicare AWV questionnaire was completed by the patient on 10/08/23; I have confirmed that all information answered by patient is correct and no changes since this date.  Cardiac Risk Factors include: advanced age (>66men, >36 women);diabetes mellitus     Objective:    Today's Vitals   10/15/23 1121  Weight: 183 lb (83 kg)  Height: 5' 3.5" (1.613 m)   Body mass index is 31.91 kg/m.     10/15/2023   11:28 AM 10/03/2022    2:27 PM 09/21/2021    9:07 AM 02/26/2017    7:11 PM 07/09/2016   10:44 AM 06/25/2016   10:05 AM  Advanced Directives  Does Patient Have a Medical Advance Directive? No No No No No No  Would patient like information on creating a medical advance directive? No - Patient declined No - Patient declined No - Patient declined       Current Medications (verified) Outpatient Encounter Medications as of 10/15/2023  Medication Sig   betamethasone, augmented, (DIPROLENE) 0.05 % lotion Apply to scalp 3 times per week. Not to face.   minoxidil (LONITEN) 2.5 MG tablet Take 0.5 tablets by mouth daily.   Multiple Vitamin (MULTIVITAMIN) capsule Take 1 capsule by mouth daily.   psyllium (METAMUCIL) 58.6 % powder Take 1 packet by mouth as  needed.   No facility-administered encounter medications on file as of 10/15/2023.    Allergies (verified) Almond mea, Almond oil, and Penicillins   History: Past Medical History:  Diagnosis Date   Allergy    Cataract    removed both - lens implants   Hx of adenomatous polyp of colon 07/22/2016   Obesity    Prediabetes    no meds- A1C 6.0   Past Surgical History:  Procedure Laterality Date   CATARACT EXTRACTION W/ INTRAOCULAR LENS IMPLANT     CESAREAN SECTION     COLONOSCOPY     KELOID EXCISION Left    age 61   POLYPECTOMY     TUBAL LIGATION     WISDOM TOOTH EXTRACTION     Family History  Problem Relation Age of Onset   Hypertension Mother    Diabetes Sister    Diabetes Son    Drug abuse Other    Diabetes Other    Colon cancer Neg Hx    Colon polyps Neg Hx    Esophageal cancer Neg Hx    Rectal cancer Neg Hx    Stomach cancer Neg Hx    Social History   Socioeconomic History   Marital status: Divorced    Spouse name: Not on file   Number of children: Not on file   Years of education: Not on file   Highest education level: Not on file  Occupational History   Not on file  Tobacco Use  Smoking status: Never   Smokeless tobacco: Never  Substance and Sexual Activity   Alcohol use: Yes    Comment: rare- very    Drug use: No   Sexual activity: Yes    Birth control/protection: Post-menopausal, Surgical  Other Topics Concern   Not on file  Social History Narrative   Not on file   Social Drivers of Health   Financial Resource Strain: Low Risk  (10/15/2023)   Overall Financial Resource Strain (CARDIA)    Difficulty of Paying Living Expenses: Not hard at all  Food Insecurity: No Food Insecurity (10/15/2023)   Hunger Vital Sign    Worried About Running Out of Food in the Last Year: Never true    Ran Out of Food in the Last Year: Never true  Transportation Needs: No Transportation Needs (10/15/2023)   PRAPARE - Administrator, Civil Service  (Medical): No    Lack of Transportation (Non-Medical): No  Physical Activity: Sufficiently Active (10/15/2023)   Exercise Vital Sign    Days of Exercise per Week: 5 days    Minutes of Exercise per Session: 60 min  Stress: No Stress Concern Present (10/15/2023)   [employer] of Occupational Health - Occupational Stress Questionnaire    Feeling of Stress : Not at all  Social Connections: Moderately Integrated (10/15/2023)   Social Connection and Isolation Panel [NHANES]    Frequency of Communication with Friends and Family: More than three times a week    Frequency of Social Gatherings with Friends and Family: More than three times a week    Attends Religious Services: More than 4 times per year    Active Member of [employer] or Organizations: Yes    Attends Engineer, structural: More than 4 times per year    Marital Status: Divorced    Tobacco Counseling Counseling given: Not Answered   Clinical Intake:  Pre-visit preparation completed: Yes  Pain : No/denies pain     BMI - recorded: 31.91 Nutritional Status: BMI > 30  Obese Nutritional Risks: None Diabetes: Yes CBG done?: No Did pt. bring in CBG monitor from home?: No  How often do you need to have someone help you when you read instructions, pamphlets, or other written materials from your doctor or pharmacy?: 1 - Never  Interpreter Needed?: No  Information entered by :: Theresa Mulligan LPN   Activities of Daily Living    10/15/2023   11:27 AM 10/08/2023   11:55 AM  In your present state of health, do you have any difficulty performing the following activities:  Hearing? 0 0  Vision? 0 0  Difficulty concentrating or making decisions? 0 0  Walking or climbing stairs? 0 0  Dressing or bathing? 0 0  Doing errands, shopping? 0 0  Preparing Food and eating ? N N  Using the Toilet? N N  In the past six months, have you accidently leaked urine? N N  Do you have problems with loss of bowel control? N N   Managing your Medications? N N  Managing your Finances? N N  Housekeeping or managing your Housekeeping? N N    Patient Care Team: [place], Betty G, MD as PCP - General (Family Medicine) [facility]  Indicate any recent Medical Services you may have received from other than [facility] providers in the past year (date may be approximate).     Assessment:   This is a routine wellness examination for Jil.  Hearing/Vision screen Hearing  Screening - Comments:: Denies hearing difficulties   Vision Screening - Comments:: Wears rx glasses - up to date with routine eye exams with  [facility]   Goals Addressed               This Visit's Progress     Weight (lb) < 200 lb (90.7 kg) (pt-stated)        10/15/23 Patient states want to maintain good eating habits.       Depression Screen    10/15/2023   11:26 AM 10/03/2022    2:25 PM 03/29/2022    8:08 AM 10/23/2021   11:42 AM 09/21/2021    9:01 AM 12/22/2020    7:26 AM 09/08/2019   10:11 AM  PHQ 2/9 Scores  PHQ - 2 Score 0 0 0 0 0 0 0    Fall Risk    10/15/2023   11:27 AM 10/08/2023   11:55 AM 10/03/2022    2:27 PM 03/29/2022    8:08 AM 09/21/2021    9:04 AM  Fall Risk   Falls in the past year? 0 0 0 0 0  Number falls in past yr: 0 0 0 0 0  Injury with Fall? 0 0 0 0 0  Risk for fall due to : No Fall Risks  No Fall Risks No Fall Risks   Follow up Falls prevention discussed  Falls prevention discussed Falls evaluation completed     MEDICARE RISK AT HOME: Medicare Risk at Home Any stairs in or around the home?: Yes If so, are there any without handrails?: No Home free of loose throw rugs in walkways, pet beds, electrical cords, etc?: Yes Adequate lighting in your home to reduce risk of falls?: Yes Life alert?: No Use of a cane, walker or w/c?: No Grab bars in the bathroom?: Yes Shower chair or bench in shower?: No Elevated toilet seat or a handicapped toilet?: No  TIMED UP AND GO:  Was the  test performed?  No    Cognitive Function:        10/15/2023   11:28 AM 10/03/2022    2:27 PM 09/21/2021    9:05 AM  6CIT Screen  What Year? 0 points 0 points 0 points  What month? 0 points 0 points 0 points  What time? 0 points 0 points 0 points  Count back from 20 0 points 0 points 0 points  Months in reverse 0 points 0 points 0 points  Repeat phrase 0 points 0 points 0 points  Total Score 0 points 0 points 0 points    Immunizations Immunization History  Administered Date(s) Administered   Fluad Quad(high Dose 65+) 06/10/2019, 06/19/2022   Influenza Split 07/29/2012   Influenza, Seasonal, Injecte, Preservative Fre 06/25/2018   Influenza,inj,Quad PF,6+ Mos 07/19/2016, 06/25/2018   Influenza-Unspecified 07/15/2014, 06/10/2017   MODERNA COVID-19 SARS-COV-2 PEDS BIVALENT BOOSTER 67yr-109yr 07/13/2021   Pneumococcal Conjugate-13 12/21/2020   Pneumococcal Polysaccharide-23 09/08/2019, 12/21/2020   Tdap 07/29/2012   Zoster Recombinant(Shingrix) 06/10/2019, 09/08/2019    TDAP status: Due, Education has been provided regarding the importance of this vaccine. Advised may receive this vaccine at local pharmacy or Health Dept. Aware to provide a copy of the vaccination record if obtained from local pharmacy or Health Dept. Verbalized acceptance and understanding.  Flu Vaccine status: Due, Education has been provided regarding the importance of this vaccine. Advised may receive this vaccine at local pharmacy or Health Dept. Aware to provide a copy of the vaccination record if obtained  from local pharmacy or Health Dept. Verbalized acceptance and understanding.  Pneumococcal vaccine status: Up to date  Covid-19 vaccine status: Declined, Education has been provided regarding the importance of this vaccine but patient still declined. Advised may receive this vaccine at local pharmacy or Health Dept.or vaccine clinic. Aware to provide a copy of the vaccination record if obtained from local  pharmacy or Health Dept. Verbalized acceptance and understanding.  Qualifies for Shingles Vaccine? Yes   Zostavax completed Yes   Shingrix Completed?: Yes  Screening Tests Health Maintenance  Topic Date Due   DTaP/Tdap/Td (2 - Td or Tdap) 07/29/2022   HEMOGLOBIN A1C  09/29/2022   Diabetic kidney evaluation - eGFR measurement  03/30/2023   Diabetic kidney evaluation - Urine ACR  03/30/2023   FOOT EXAM  03/30/2023   INFLUENZA VACCINE  04/24/2023   COVID-19 Vaccine (2 - 2024-25 season) 05/25/2023   OPHTHALMOLOGY EXAM  03/16/2024   Medicare Annual Wellness (AWV)  10/14/2024   MAMMOGRAM  08/31/2025   Colonoscopy  07/19/2028   Pneumonia Vaccine 13+ Years old  Completed   DEXA SCAN  Completed   Hepatitis C Screening  Completed   Zoster Vaccines- Shingrix  Completed   HPV VACCINES  Aged Out    Health Maintenance  Health Maintenance Due  Topic Date Due   DTaP/Tdap/Td (2 - Td or Tdap) 07/29/2022   HEMOGLOBIN A1C  09/29/2022   Diabetic kidney evaluation - eGFR measurement  03/30/2023   Diabetic kidney evaluation - Urine ACR  03/30/2023   FOOT EXAM  03/30/2023   INFLUENZA VACCINE  04/24/2023   COVID-19 Vaccine (2 - 2024-25 season) 05/25/2023    Colorectal cancer screening: Type of screening: Colonoscopy. Completed 07/19/21. Repeat every 7 years  Mammogram status: Completed 09/01/23. Repeat every year  Bone Density status: Completed 01/01/21. Results reflect: Bone density results: OSTEOPOROSIS. Repeat every   years.   Additional Screening:  Hepatitis C Screening: does qualify; Completed 08/24/18  Vision Screening: Recommended annual ophthalmology exams for early detection of glaucoma and other disorders of the eye. Is the patient up to date with their annual eye exam?  Yes  Who is the provider or what is the name of the office in which the patient attends annual eye exams? [facility] If pt is not established with a provider, would they like to be referred to a provider to  establish care? No .   Dental Screening: Recommended annual dental exams for proper oral hygiene  Diabetic Foot Exam: Diabetic Foot Exam: Overdue, Pt has been advised about the importance in completing this exam. Pt is scheduled for diabetic foot exam on Deferred.  Community Resource Referral / Chronic Care Management:  CRR required this visit?  No   CCM required this visit?  No     Plan:     I have personally reviewed and noted the following in the patient's chart:   Medical and social history Use of alcohol, tobacco or illicit drugs  Current medications and supplements including opioid prescriptions. Patient is not currently taking opioid prescriptions. Functional ability and status Nutritional status Physical activity Advanced directives List of other physicians Hospitalizations, surgeries, and ER visits in previous 12 months Vitals Screenings to include cognitive, depression, and falls Referrals and appointments  In addition, I have reviewed and discussed with patient certain preventive protocols, quality metrics, and best practice recommendations. A written personalized care plan for preventive services as well as general preventive health recommendations were provided to patient.  Tillie Rung, LPN   10/11/1476   After Visit Summary: (MyChart) Due to this being a telephonic visit, the after visit summary with patients personalized plan was offered to patient via MyChart   Nurse Notes: Patient due labs Diabetic kidney evaluation urine ACR and eGFR measurement also Hemoglobin A1C.

## 2023-10-15 NOTE — Patient Instructions (Addendum)
Janice Williamson , Thank you for taking time to come for your Medicare Wellness Visit. I appreciate your ongoing commitment to your health goals. Please review the following plan we discussed and let me know if I can assist you in the future.   Referrals/Orders/Follow-Ups/Clinician Recommendations:   This is a list of the screening recommended for you and due dates:  Health Maintenance  Topic Date Due   DTaP/Tdap/Td vaccine (2 - Td or Tdap) 07/29/2022   Hemoglobin A1C  09/29/2022   Yearly kidney function blood test for diabetes  03/30/2023   Yearly kidney health urinalysis for diabetes  03/30/2023   Complete foot exam   03/30/2023   Flu Shot  04/24/2023   COVID-19 Vaccine (2 - 2024-25 season) 05/25/2023   Eye exam for diabetics  03/16/2024   Medicare Annual Wellness Visit  10/14/2024   Mammogram  08/31/2025   Colon Cancer Screening  07/19/2028   Pneumonia Vaccine  Completed   DEXA scan (bone density measurement)  Completed   Hepatitis C Screening  Completed   Zoster (Shingles) Vaccine  Completed   HPV Vaccine  Aged Out    Advanced directives: (Declined) Advance directive discussed with you today. Even though you declined this today, please call our office should you change your mind, and we can give you the proper paperwork for you to fill out.  Next Medicare Annual Wellness Visit scheduled for next year: Yes

## 2024-01-14 NOTE — Progress Notes (Incomplete)
 HPI: Janice Williamson is a 68 y.o. female with a PMHx significant for HLD, DM II, depression, and insomnia, among some, who is here today for her routine physical.  Last CPE: 03/29/2022  Exercise: Diet:  Sleep:  Alcohol Use:  Smoking: Vision:  Dental:  Immunization History  Administered Date(s) Administered   Fluad Quad(high Dose 65+) 06/10/2019, 06/19/2022   Influenza Split 07/29/2012   Influenza, Seasonal, Injecte, Preservative Fre 06/25/2018   Influenza,inj,Quad PF,6+ Mos 07/19/2016, 06/25/2018   Influenza-Unspecified 07/15/2014, 06/10/2017   MODERNA COVID-19 SARS-COV-2 PEDS BIVALENT BOOSTER 31yr-42yr 07/13/2021   Pneumococcal Conjugate-13 12/21/2020   Pneumococcal Polysaccharide-23 09/08/2019, 12/21/2020   Tdap 07/29/2012   Zoster Recombinant(Shingrix ) 06/10/2019, 09/08/2019   Health Maintenance  Topic Date Due   DTaP/Tdap/Td (2 - Td or Tdap) 07/29/2022   HEMOGLOBIN A1C  09/29/2022   Diabetic kidney evaluation - eGFR measurement  03/30/2023   Diabetic kidney evaluation - Urine ACR  03/30/2023   FOOT EXAM  03/30/2023   COVID-19 Vaccine (2 - 2024-25 season) 05/25/2023   OPHTHALMOLOGY EXAM  03/16/2024   INFLUENZA VACCINE  04/23/2024   Medicare Annual Wellness (AWV)  10/14/2024   MAMMOGRAM  08/31/2025   Colonoscopy  07/19/2028   Pneumonia Vaccine 29+ Years old  Completed   DEXA SCAN  Completed   Hepatitis C Screening  Completed   Zoster Vaccines- Shingrix   Completed   HPV VACCINES  Aged Out   Meningococcal B Vaccine  Aged Out   Chronic medical problems: ***  Concerns today: ***  Review of Systems  Current Outpatient Medications on File Prior to Visit  Medication Sig Dispense Refill   betamethasone , augmented, (DIPROLENE ) 0.05 % lotion Apply to scalp 3 times per week. Not to face.     minoxidil (LONITEN) 2.5 MG tablet Take 0.5 tablets by mouth daily.     Multiple Vitamin (MULTIVITAMIN) capsule Take 1 capsule by mouth daily.     psyllium (METAMUCIL) 58.6 %  powder Take 1 packet by mouth as needed.     No current facility-administered medications on file prior to visit.    Past Medical History:  Diagnosis Date   Allergy    Cataract    removed both - lens implants   Hx of adenomatous polyp of colon 07/22/2016   Obesity    Prediabetes    no meds- A1C 6.0    Past Surgical History:  Procedure Laterality Date   CATARACT EXTRACTION W/ INTRAOCULAR LENS IMPLANT     CESAREAN SECTION     COLONOSCOPY     KELOID EXCISION Left    age 7   POLYPECTOMY     TUBAL LIGATION     WISDOM TOOTH EXTRACTION      Allergies  Allergen Reactions   Almond Meal (Obsolete) Anaphylaxis    Almonds    Almond Oil     Other reaction(s): Other (See Comments)   Penicillins     REACTION: hives    Family History  Problem Relation Age of Onset   Hypertension Mother    Diabetes Sister    Diabetes Son    Drug abuse Other    Diabetes Other    Colon cancer Neg Hx    Colon polyps Neg Hx    Esophageal cancer Neg Hx    Rectal cancer Neg Hx    Stomach cancer Neg Hx     Social History   Socioeconomic History   Marital status: Divorced    Spouse name: Not on file   Number of  children: Not on file   Years of education: Not on file   Highest education level: Not on file  Occupational History   Not on file  Tobacco Use   Smoking status: Never   Smokeless tobacco: Never  Substance and Sexual Activity   Alcohol use: Yes    Comment: rare- very    Drug use: No   Sexual activity: Yes    Birth control/protection: Post-menopausal, Surgical  Other Topics Concern   Not on file  Social History Narrative   Not on file   Social Drivers of Health   Financial Resource Strain: Low Risk  (10/15/2023)   Overall Financial Resource Strain (CARDIA)    Difficulty of Paying Living Expenses: Not hard at all  Food Insecurity: No Food Insecurity (10/15/2023)   Hunger Vital Sign    Worried About Running Out of Food in the Last Year: Never true    Ran Out of Food in  the Last Year: Never true  Transportation Needs: No Transportation Needs (10/15/2023)   PRAPARE - Administrator, Civil Service (Medical): No    Lack of Transportation (Non-Medical): No  Physical Activity: Sufficiently Active (10/15/2023)   Exercise Vital Sign    Days of Exercise per Week: 5 days    Minutes of Exercise per Session: 60 min  Stress: No Stress Concern Present (10/15/2023)   [employer] of Occupational Health - Occupational Stress Questionnaire    Feeling of Stress : Not at all  Social Connections: Moderately Integrated (10/15/2023)   Social Connection and Isolation Panel [NHANES]    Frequency of Communication with Friends and Family: More than three times a week    Frequency of Social Gatherings with Friends and Family: More than three times a week    Attends Religious Services: More than 4 times per year    Active Member of [employer] or Organizations: Yes    Attends Engineer, structural: More than 4 times per year    Marital Status: Divorced    There were no vitals filed for this visit. There is no height or weight on file to calculate BMI.  Wt Readings from Last 3 Encounters:  10/15/23 183 lb (83 kg)  10/03/22 166 lb (75.3 kg)  03/29/22 166 lb (75.3 kg)    Physical Exam  ASSESSMENT AND PLAN:  Janice Williamson was here today for her annual physical examination.  No orders of the defined types were placed in this encounter.  @ASSESSPLAN @  There are no diagnoses linked to this encounter.  No follow-ups on file.  I, Fritz Jewel Wierda, acting as a scribe for Betty [place], MD., have documented all relevant documentation on the behalf of Betty [place], MD, as directed by  Betty [place], MD while in the presence of Betty [place], MD.   I, Betty [place], MD, have reviewed all documentation for this visit. The documentation on 01/14/24 for the exam, diagnosis, procedures, and orders are all accurate and complete.  Betty G. [place], MD  [facility]. [place] office.

## 2024-01-16 ENCOUNTER — Encounter: Admitting: Family Medicine

## 2024-01-29 NOTE — Progress Notes (Signed)
 HPI: [name] is a 68 y.o. female with PMHx significant for type 2 diabetes and hyperlipidemia, who is here today for her routine physical.  Last CPE: 03/29/22  Exercise: walking about 4 miles 5 days a week.  Diet: Eats a well-balanced diet including vegetables; home-cooked meals. Sleep: 7 hours a night. No concerns reported.  Tobacco: None.  Alcohol consumption: None.  Dental: UTD with routine dental care.  Vision: UTD with routine vision exams, next due in June.  Immunization History  Administered Date(s) Administered   Fluad Quad(high Dose 65+) 06/10/2019, 06/19/2022   Influenza Split 07/29/2012   Influenza, Seasonal, Injecte, Preservative Fre 06/25/2018   Influenza,inj,Quad PF,6+ Mos 07/19/2016, 06/25/2018   Influenza-Unspecified 07/15/2014, 06/10/2017   MODERNA COVID-19 SARS-COV-2 PEDS BIVALENT BOOSTER 28yr-59yr 07/13/2021   Pneumococcal Conjugate-13 12/21/2020   Pneumococcal Polysaccharide-23 09/08/2019, 12/21/2020   Tdap 07/29/2012   Zoster Recombinant(Shingrix ) 06/10/2019, 09/08/2019   Health Maintenance  Topic Date Due   HEMOGLOBIN A1C  09/29/2022   Diabetic kidney evaluation - eGFR measurement  03/30/2023   Diabetic kidney evaluation - Urine ACR  03/30/2023   COVID-19 Vaccine (2 - 2024-25 season) 02/15/2024 (Originally 05/25/2023)   DTaP/Tdap/Td (2 - Td or Tdap) 10/01/2024 (Originally 07/29/2022)   OPHTHALMOLOGY EXAM  03/16/2024   INFLUENZA VACCINE  04/23/2024   Medicare Annual Wellness (AWV)  10/14/2024   FOOT EXAM  01/29/2025   MAMMOGRAM  08/31/2025   Colonoscopy  07/19/2028   Pneumonia Vaccine 21+ Years old  Completed   DEXA SCAN  Completed   Hepatitis C Screening  Completed   Zoster Vaccines- Shingrix   Completed   HPV VACCINES  Aged Out   Meningococcal B Vaccine  Aged Out   Chronic medical problems:  Type 2 diabetes: Diagnosed in 08/2018 with a hemoglobin A1c of 6.7. Pt is not currently managing condition with any medications. She states she was  not aware she was diagnosed with diabetes, given her condition is well controlled.  She has been working on [employer] and exercise.   Lab Results  Component Value Date   HGBA1C 6.0 03/29/2022   Hyperlipidemia: Pt not currently on any medications.  She states she has not previously been on any medications to manage her condition.  Denies any chest pain, shortness of breath, or palpitations.  Lab Results  Component Value Date   CHOL 177 03/29/2022   HDL 67.50 03/29/2022   LDLCALC 103 (H) 03/29/2022   TRIG 35.0 03/29/2022   CHOLHDL 3 03/29/2022   Acute concerns today: None.   Review of Systems  Constitutional:  Negative for activity change, appetite change and fever.  HENT:  Negative for hearing loss, mouth sores, sore throat and trouble swallowing.   Eyes:  Negative for redness and visual disturbance.  Respiratory:  Negative for cough, shortness of breath and wheezing.   Cardiovascular:  Negative for chest pain and leg swelling.  Gastrointestinal:  Negative for abdominal pain, nausea and vomiting.  Endocrine: Negative for cold intolerance, heat intolerance, polydipsia, polyphagia and polyuria.  Genitourinary:  Negative for decreased urine volume, dysuria, hematuria, vaginal bleeding and vaginal discharge.  Musculoskeletal:  Negative for gait problem and myalgias.  Skin:  Negative for color change and rash.  Allergic/Immunologic: Negative for environmental allergies.  Neurological:  Negative for seizures, syncope, weakness and headaches.  Hematological:  Negative for adenopathy. Does not bruise/bleed easily.  Psychiatric/Behavioral:  Negative for confusion. The patient is not nervous/anxious.   All other systems reviewed and are negative.  Current Outpatient Medications on  File Prior to Visit  Medication Sig Dispense Refill   betamethasone , augmented, (DIPROLENE ) 0.05 % lotion Apply to scalp 3 times per week. Not to face.     minoxidil (LONITEN) 2.5 MG tablet Take 0.5  tablets by mouth daily.     Multiple Vitamin (MULTIVITAMIN) capsule Take 1 capsule by mouth daily.     psyllium (METAMUCIL) 58.6 % powder Take 1 packet by mouth as needed.     No current facility-administered medications on file prior to visit.   Past Medical History:  Diagnosis Date   Allergy    Cataract    removed both - lens implants   Hx of adenomatous polyp of colon 07/22/2016   Obesity    Prediabetes    no meds- A1C 6.0   Past Surgical History:  Procedure Laterality Date   CATARACT EXTRACTION W/ INTRAOCULAR LENS IMPLANT     CESAREAN SECTION     COLONOSCOPY     KELOID EXCISION Left    age 43   POLYPECTOMY     TUBAL LIGATION     WISDOM TOOTH EXTRACTION     Allergies  Allergen Reactions   Almond Meal (Obsolete) Anaphylaxis    Almonds    Almond Oil     Other reaction(s): Other (See Comments)   Penicillins     REACTION: hives   Family History  Problem Relation Age of Onset   Hypertension Mother    Diabetes Sister    Diabetes Son    Drug abuse Other    Diabetes Other    Colon cancer Neg Hx    Colon polyps Neg Hx    Esophageal cancer Neg Hx    Rectal cancer Neg Hx    Stomach cancer Neg Hx    Social History   Socioeconomic History   Marital status: Divorced    Spouse name: Not on file   Number of children: Not on file   Years of education: Not on file   Highest education level: Not on file  Occupational History   Not on file  Tobacco Use   Smoking status: Never   Smokeless tobacco: Never  Substance and Sexual Activity   Alcohol use: Yes    Comment: rare- very    Drug use: No   Sexual activity: Yes    Birth control/protection: Post-menopausal, Surgical  Other Topics Concern   Not on file  Social History Narrative   Not on file   Social Drivers of Health   Financial Resource Strain: Low Risk  (10/15/2023)   Overall Financial Resource Strain (CARDIA)    Difficulty of Paying Living Expenses: Not hard at all  Food Insecurity: No Food Insecurity  (10/15/2023)   Hunger Vital Sign    Worried About Running Out of Food in the Last Year: Never true    Ran Out of Food in the Last Year: Never true  Transportation Needs: No Transportation Needs (10/15/2023)   PRAPARE - Administrator, Civil Service (Medical): No    Lack of Transportation (Non-Medical): No  Physical Activity: Sufficiently Active (10/15/2023)   Exercise Vital Sign    Days of Exercise per Week: 5 days    Minutes of Exercise per Session: 60 min  Stress: No Stress Concern Present (10/15/2023)   [employer] of Occupational Health - Occupational Stress Questionnaire    Feeling of Stress : Not at all  Social Connections: Moderately Integrated (10/15/2023)   Social Connection and Isolation Panel [NHANES]    Frequency of Communication  with Friends and Family: More than three times a week    Frequency of Social Gatherings with Friends and Family: More than three times a week    Attends Religious Services: More than 4 times per year    Active Member of Clubs or Organizations: Yes    Attends Banker Meetings: More than 4 times per year    Marital Status: Divorced   Vitals:   01/30/24 0659  BP: 122/70  Pulse: 67  Resp: 16  Temp: 98 F (36.7 C)  SpO2: 97%   Body mass index is 34.2 kg/m. Wt Readings from Last 3 Encounters:  01/30/24 196 lb 2 oz (89 kg)  10/15/23 183 lb (83 kg)  10/03/22 166 lb (75.3 kg)   Physical Exam Vitals and nursing note reviewed.  Constitutional:      General: She is not in acute distress.    Appearance: She is well-developed.  HENT:     Head: Normocephalic and atraumatic.     Right Ear: Tympanic membrane and external ear normal.     Left Ear: Tympanic membrane, ear canal and external ear normal.     Ears:     Comments: Cerumen excess in right ear canal, TM seen partially.    Mouth/Throat:     Mouth: Mucous membranes are moist.     Pharynx: Oropharynx is clear. Uvula midline.  Eyes:     Extraocular Movements:  Extraocular movements intact.     Conjunctiva/sclera: Conjunctivae normal.     Pupils: Pupils are equal, round, and reactive to light.  Neck:     Thyroid : No thyromegaly.     Trachea: No tracheal deviation.  Cardiovascular:     Rate and Rhythm: Normal rate and regular rhythm.     Pulses:          Dorsalis pedis pulses are 2+ on the right side and 2+ on the left side.     Heart sounds: No murmur heard. Pulmonary:     Effort: Pulmonary effort is normal. No respiratory distress.     Breath sounds: Normal breath sounds.  Abdominal:     Palpations: Abdomen is soft. There is no hepatomegaly or mass.     Tenderness: There is no abdominal tenderness.  Genitourinary:    Comments: No concerns today. Musculoskeletal:     Comments: No signs of synovitis appreciated.  Lymphadenopathy:     Cervical: No cervical adenopathy.     Upper Body:     Right upper body: No supraclavicular adenopathy.     Left upper body: No supraclavicular adenopathy.  Skin:    General: Skin is warm.     Findings: No erythema or rash.  Neurological:     General: No focal deficit present.     Mental Status: She is alert and oriented to person, place, and time.     Cranial Nerves: No cranial nerve deficit.     Coordination: Coordination normal.     Gait: Gait normal.     Deep Tendon Reflexes:     Reflex Scores:      Bicep reflexes are 2+ on the right side and 2+ on the left side.      Patellar reflexes are 2+ on the right side and 2+ on the left side. Psychiatric:        Mood and Affect: Mood and affect normal.   ASSESSMENT AND PLAN: [name] was here today annual physical examination.  Orders Placed This Encounter  Procedures  Comprehensive metabolic panel with GFR   Microalbumin / creatinine urine ratio   Lipid panel   Hemoglobin A1c   Lab Results  Component Value Date   HGBA1C 6.4 01/30/2024   Lab Results  Component Value Date   NA 138 01/30/2024   CL 102 01/30/2024   K 3.8  01/30/2024   CO2 28 01/30/2024   BUN 17 01/30/2024   CREATININE 0.81 01/30/2024   GFR 73.69 01/30/2024   CALCIUM 9.1 01/30/2024   ALBUMIN 4.0 01/30/2024   GLUCOSE 104 (H) 01/30/2024   Lab Results  Component Value Date   ALT 21 01/30/2024   AST 25 01/30/2024   ALKPHOS 69 01/30/2024   BILITOT 0.4 01/30/2024   Lab Results  Component Value Date   CHOL 207 (H) 01/30/2024   HDL 63.20 01/30/2024   LDLCALC 135 (H) 01/30/2024   TRIG 43.0 01/30/2024   CHOLHDL 3 01/30/2024   Lab Results  Component Value Date   MICROALBUR <0.7 01/30/2024   MICROALBUR 1.0 03/29/2022   Routine general medical examination at a health care facility Assessment & Plan: We discussed the importance of regular physical activity and healthy diet for prevention of chronic illness and/or complications. Preventive guidelines reviewed. Vaccination up-to-date. Ca++ and vit D supplementation to continue. Next CPE in a year.   Type 2 diabetes mellitus with other specified complication, without long-term current use of insulin (HCC) Assessment & Plan: With associated obesity and HLD. HgA1C has been at goal. Continue nonpharmacologic treatment. Continue appropriate foot care and periodic eye exams and dental care. F/U in 5-6 months.  Orders: -     Comprehensive metabolic panel with GFR; Future -     Microalbumin / creatinine urine ratio; Future -     Hemoglobin A1c; Future  Hyperlipidemia, unspecified hyperlipidemia type Assessment & Plan: Currently she is not on pharmacologic treatment. We discussed benefits of statin medication. For now continue low-fat diet, further recommendation will be given according to lipid panel results.  Orders: -     Comprehensive metabolic panel with GFR; Future -     Lipid panel; Future  Return in 6 months (on 08/01/2024) for chronic problems.  I, Bernita Bristle, acting as a scribe for Jaretssi Kraker [place], MD., have documented all relevant documentation on the behalf of Lenton Gendreau  [place], MD, as directed by   while in the presence of Kattia Selley [place], MD.  I, Heaven Meeker [place], MD, have reviewed all documentation for this visit. The documentation on 01/30/24 for the exam, diagnosis, procedures, and orders are all accurate and complete.  Lavada Langsam G. [place], MD  [facility] [place] office.

## 2024-01-30 ENCOUNTER — Encounter: Payer: Self-pay | Admitting: Family Medicine

## 2024-01-30 ENCOUNTER — Encounter: Admitting: Family Medicine

## 2024-01-30 ENCOUNTER — Ambulatory Visit (INDEPENDENT_AMBULATORY_CARE_PROVIDER_SITE_OTHER): Admitting: Family Medicine

## 2024-01-30 VITALS — BP 122/70 | HR 67 | Temp 98.0°F | Resp 16 | Ht 63.5 in | Wt 196.1 lb

## 2024-01-30 DIAGNOSIS — E1169 Type 2 diabetes mellitus with other specified complication: Secondary | ICD-10-CM | POA: Diagnosis not present

## 2024-01-30 DIAGNOSIS — E785 Hyperlipidemia, unspecified: Secondary | ICD-10-CM | POA: Diagnosis not present

## 2024-01-30 DIAGNOSIS — Z0000 Encounter for general adult medical examination without abnormal findings: Secondary | ICD-10-CM | POA: Diagnosis not present

## 2024-01-30 LAB — LIPID PANEL
Cholesterol: 207 mg/dL — ABNORMAL HIGH (ref 0–200)
HDL: 63.2 mg/dL (ref >39.00)
LDL Cholesterol: 135 mg/dL — ABNORMAL HIGH (ref 0–99)
NonHDL: 143.7
Total CHOL/HDL Ratio: 3
Triglycerides: 43 mg/dL (ref 0.0–149.0)
VLDL: 8.6 mg/dL (ref 0.0–40.0)

## 2024-01-30 LAB — COMPREHENSIVE METABOLIC PANEL WITH GFR
ALT: 21 U/L (ref 0–35)
AST: 25 U/L (ref 0–37)
Albumin: 4 g/dL (ref 3.5–5.2)
Alkaline Phosphatase: 69 U/L (ref 39–117)
BUN: 17 mg/dL (ref 6–23)
CO2: 28 meq/L (ref 19–32)
Calcium: 9.1 mg/dL (ref 8.4–10.5)
Chloride: 102 meq/L (ref 96–112)
Creatinine, Ser: 0.81 mg/dL (ref 0.40–1.20)
GFR: 73.69 mL/min (ref >60.00)
Glucose, Bld: 104 mg/dL — ABNORMAL HIGH (ref 70–99)
Potassium: 3.8 meq/L (ref 3.5–5.1)
Sodium: 138 meq/L (ref 135–145)
Total Bilirubin: 0.4 mg/dL (ref 0.2–1.2)
Total Protein: 7.1 g/dL (ref 6.0–8.3)

## 2024-01-30 LAB — MICROALBUMIN / CREATININE URINE RATIO
Creatinine,U: 47.1 mg/dL
Microalb Creat Ratio: UNDETERMINED mg/g (ref 0.0–30.0)
Microalb, Ur: <0.7 mg/dL

## 2024-01-30 LAB — HEMOGLOBIN A1C: Hgb A1c MFr Bld: 6.4 % (ref 4.6–6.5)

## 2024-01-30 NOTE — Assessment & Plan Note (Addendum)
 With associated obesity and HLD. HgA1C has been at goal. Continue nonpharmacologic treatment. Continue appropriate foot care and periodic eye exams and dental care. F/U in 5-6 months.

## 2024-01-30 NOTE — Assessment & Plan Note (Signed)
We discussed the importance of regular physical activity and healthy diet for prevention of chronic illness and/or complications. Preventive guidelines reviewed. Vaccination up to date. Ca++ and vit D supplementation to continue. Next CPE in a year. 

## 2024-01-30 NOTE — Assessment & Plan Note (Signed)
 Currently she is not on pharmacologic treatment. We discussed benefits of statin medication. For now continue low-fat diet, further recommendation will be given according to lipid panel results.

## 2024-01-30 NOTE — Patient Instructions (Addendum)
 A few things to remember from today's visit:  Routine general medical examination at a health care facility  Type 2 diabetes mellitus with other specified complication, without long-term current use of insulin (HCC) - Plan: Comprehensive metabolic panel with GFR, Microalbumin / creatinine urine ratio, Hemoglobin A1c  Hyperlipidemia, unspecified hyperlipidemia type - Plan: Comprehensive metabolic panel with GFR, Lipid panel  Do not use My Chart to request refills or for acute issues that need immediate attention. If you send a my chart message, it may take a few days to be addressed, specially if I am not in the office.  Please be sure medication list is accurate. If a new problem present, please set up appointment sooner than planned today.

## 2024-04-22 ENCOUNTER — Telehealth: Admitting: Internal Medicine

## 2024-04-22 ENCOUNTER — Ambulatory Visit: Payer: Self-pay

## 2024-04-22 ENCOUNTER — Encounter: Payer: Self-pay | Admitting: Internal Medicine

## 2024-04-22 VITALS — Wt 185.0 lb

## 2024-04-22 DIAGNOSIS — U071 COVID-19: Secondary | ICD-10-CM

## 2024-04-22 MED ORDER — NIRMATRELVIR/RITONAVIR (PAXLOVID)TABLET: 3.0000 | ORAL_TABLET | Freq: Two times a day (BID) | ORAL | 0 refills | Status: AC

## 2024-04-22 NOTE — Telephone Encounter (Signed)
 FYI Only or Action Required?: Action required by provider: request for appointment.  Patient was last seen in primary care on 01/30/2024 by [place], Betty G, MD.  Called Nurse Triage reporting Covid Positive.  Symptoms began yesterday.  Interventions attempted: Rest, hydration, or home remedies.  Symptoms are: gradually worsening.COVID positive today, symptoms started yesterday. Cough, SOB, Sneezing.  Triage Disposition: See HCP Within 4 Hours (Or PCP Triage)  Patient/caregiver understands and will follow disposition?: Yes    Copied from CRM [id number]. Topic: Clinical - Red Word Triage >> Apr 22, 2024  1:58 PM Janice Williamson wrote: Red Word that prompted transfer to Nurse Triage: Patient is calling for a positive covid test, experiencing shortness of breath + coughing and sneezing. Reason for Disposition  MILD difficulty breathing (e.g., minimal/no SOB at rest, SOB with walking, pulse <100)  Answer Assessment - Initial Assessment Questions 1. COVID-19 DIAGNOSIS: How do you know that you have COVID? (e.g., positive lab test or self-test, diagnosed by doctor or NP/PA, symptoms after exposure).     Home test 2. COVID-19 EXPOSURE: Was there any known exposure to COVID before the symptoms began? CDC Definition of close contact: within 6 feet (2 meters) for a total of 15 minutes or more over a 24-hour period.      no 3. ONSET: When did the COVID-19 symptoms start?      Yesterday 4. WORST SYMPTOM: What is your worst symptom? (e.g., cough, fever, shortness of breath, muscle aches)     cough 5. COUGH: Do you have a cough? If Yes, ask: How bad is the cough?       yes 6. FEVER: Do you have a fever? If Yes, ask: What is your temperature, how was it measured, and when did it start?     NO 7. RESPIRATORY STATUS: Describe your breathing? (e.g., normal; shortness of breath, wheezing, unable to speak)      SOB 8. BETTER-SAME-WORSE: Are you getting better, staying the same or getting  worse compared to yesterday?  If getting worse, ask, In what way?     WORSE 9. OTHER SYMPTOMS: Do you have any other symptoms?  (e.g., chills, fatigue, headache, loss of smell or taste, muscle pain, sore throat)     Fatigue 10. HIGH RISK DISEASE: Do you have any chronic medical problems? (e.g., asthma, heart or lung disease, weak immune system, obesity, etc.)       no 11. VACCINE: Have you had the COVID-19 vaccine? If Yes, ask: Which one, how many shots, when did you get it?       N/a 12. PREGNANCY: Is there any chance you are pregnant? When was your last menstrual period?       no 13. O2 SATURATION MONITOR:  Do you use an oxygen saturation monitor (pulse oximeter) at home? If Yes, ask What is your reading (oxygen level) today? What is your usual oxygen saturation reading? (e.g., 95%)       no  Protocols used: Coronavirus (COVID-19) Diagnosed or Suspected-A-AH

## 2024-04-22 NOTE — Progress Notes (Signed)
 Per patient no change in vitals since last visit, unable to obtain new vitals due to telehealth visit.

## 2024-04-22 NOTE — Progress Notes (Signed)
 Virtual Visit via Video Note  I connected with Janice Williamson on 04/22/24 at  2:30 PM EDT by a video enabled telemedicine application and verified that I am speaking with the correct person using two identifiers.  Location patient: home Location provider: work office Persons participating in the virtual visit: patient, provider  I discussed the limitations of evaluation and management by telemedicine and the availability of in person appointments. The patient expressed understanding and agreed to proceed.   HPI: She is scheduled this visit to notify us  that she has tested positive for COVID.  For the past 24 to 36 hours she has been coughing, sneezing and feeling very fatigued.  Yesterday she had some mild shortness of breath with exertion.  She decided to check a COVID test which was positive.   ROS: Negative unless indicated in HPI.  Past Medical History:  Diagnosis Date   Allergy    Cataract    removed both - lens implants   Hx of adenomatous polyp of colon 07/22/2016   Obesity    Prediabetes    no meds- A1C 6.0    Past Surgical History:  Procedure Laterality Date   CATARACT EXTRACTION W/ INTRAOCULAR LENS IMPLANT     CESAREAN SECTION     COLONOSCOPY     KELOID EXCISION Left    age 68   POLYPECTOMY     TUBAL LIGATION     WISDOM TOOTH EXTRACTION      Family History  Problem Relation Age of Onset   Hypertension Mother    Diabetes Sister    Diabetes Son    Drug abuse Other    Diabetes Other    Colon cancer Neg Hx    Colon polyps Neg Hx    Esophageal cancer Neg Hx    Rectal cancer Neg Hx    Stomach cancer Neg Hx     SOCIAL HX:   reports that she has never smoked. She has never used smokeless tobacco. She reports current alcohol use. She reports that she does not use drugs.   Current Outpatient Medications:    Multiple Vitamin (MULTIVITAMIN) capsule, Take 1 capsule by mouth daily., Disp: , Rfl:    nirmatrelvir /ritonavir  (PAXLOVID ) 20 x 150 MG & 10 x  100MG  TABS, Take 3 tablets by mouth 2 (two) times daily for 5 days. (Take nirmatrelvir  150 mg two tablets twice daily for 5 days and ritonavir  100 mg one tablet twice daily for 5 days) Patient GFR is 73, Disp: 30 tablet, Rfl: 0   psyllium (METAMUCIL) 58.6 % powder, Take 1 packet by mouth as needed., Disp: , Rfl:    betamethasone , augmented, (DIPROLENE ) 0.05 % lotion, Apply to scalp 3 times per week. Not to face., Disp: , Rfl:    minoxidil (LONITEN) 2.5 MG tablet, Take 0.5 tablets by mouth daily., Disp: , Rfl:   EXAM:   VITALS per patient if applicable: None reported  GENERAL: alert, oriented, appears well and in no acute distress  HEENT: atraumatic, conjunttiva clear, no obvious abnormalities on inspection of external nose and ears  NECK: normal movements of the head and neck  LUNGS: on inspection no signs of respiratory distress, breathing rate appears normal, no obvious gross increased work of breathing, gasping or wheezing  CV: no obvious cyanosis  MS: moves all visible extremities without noticeable abnormality  PSYCH/NEURO: pleasant and cooperative, no obvious depression or anxiety, speech and thought processing grossly intact  ASSESSMENT AND PLAN:   COVID-19 - Plan: nirmatrelvir /ritonavir  (  PAXLOVID ) 20 x 150 MG & 10 x 100MG  TABS  -She may also use OTC medications such as antihistamines, decongestants, pain relievers, guaifenesin. -We have reviewed quarantine period of 5 days. -We have discussed symptoms that would promote ED evaluation. -She knows to follow with us  if symptoms fail to resolve.    I discussed the assessment and treatment plan with the patient. The patient was provided an opportunity to ask questions and all were answered. The patient agreed with the plan and demonstrated an understanding of the instructions.   The patient was advised to call back or seek an in-person evaluation if the symptoms worsen or if the condition fails to improve as  anticipated.    Tully Theophilus Andrews, MD  [facility]

## 2024-09-10 LAB — HM MAMMOGRAPHY

## 2024-09-13 ENCOUNTER — Encounter: Payer: Self-pay | Admitting: Family Medicine

## 2024-10-25 ENCOUNTER — Ambulatory Visit

## 2025-01-03 ENCOUNTER — Ambulatory Visit
